# Patient Record
Sex: Female | Born: 1955 | Race: Black or African American | Hispanic: No | Marital: Single | Smoking: Never smoker
Health system: Southern US, Community
[De-identification: ages and names within clinical notes are randomized; demographics above are authoritative.]

## PROBLEM LIST (undated history)

## (undated) DIAGNOSIS — E119 Type 2 diabetes mellitus without complications: Secondary | ICD-10-CM

## (undated) DIAGNOSIS — B009 Herpesviral infection, unspecified: Secondary | ICD-10-CM

## (undated) DIAGNOSIS — I10 Essential (primary) hypertension: Secondary | ICD-10-CM

## (undated) HISTORY — PX: EYE SURGERY: SHX253

## (undated) HISTORY — PX: TONSILLECTOMY: SUR1361

---

## 1998-04-05 ENCOUNTER — Other Ambulatory Visit: Admission: RE | Admit: 1998-04-05 | Discharge: 1998-04-05 | Payer: Self-pay | Admitting: Obstetrics and Gynecology

## 1999-01-23 ENCOUNTER — Encounter (INDEPENDENT_AMBULATORY_CARE_PROVIDER_SITE_OTHER): Payer: Self-pay | Admitting: Specialist

## 1999-01-23 ENCOUNTER — Other Ambulatory Visit: Admission: RE | Admit: 1999-01-23 | Discharge: 1999-01-23 | Payer: Self-pay | Admitting: Obstetrics and Gynecology

## 1999-08-02 ENCOUNTER — Other Ambulatory Visit: Admission: RE | Admit: 1999-08-02 | Discharge: 1999-08-02 | Payer: Self-pay | Admitting: Obstetrics and Gynecology

## 2000-07-31 ENCOUNTER — Other Ambulatory Visit: Admission: RE | Admit: 2000-07-31 | Discharge: 2000-07-31 | Payer: Self-pay | Admitting: Obstetrics and Gynecology

## 2001-09-15 ENCOUNTER — Other Ambulatory Visit: Admission: RE | Admit: 2001-09-15 | Discharge: 2001-09-15 | Payer: Self-pay | Admitting: Obstetrics and Gynecology

## 2002-10-06 ENCOUNTER — Other Ambulatory Visit: Admission: RE | Admit: 2002-10-06 | Discharge: 2002-10-06 | Payer: Self-pay | Admitting: Obstetrics and Gynecology

## 2005-01-10 ENCOUNTER — Other Ambulatory Visit: Admission: RE | Admit: 2005-01-10 | Discharge: 2005-01-10 | Payer: Self-pay | Admitting: Obstetrics and Gynecology

## 2005-04-24 ENCOUNTER — Ambulatory Visit (HOSPITAL_COMMUNITY): Admission: RE | Admit: 2005-04-24 | Discharge: 2005-04-24 | Payer: Self-pay | Admitting: Gastroenterology

## 2007-08-10 ENCOUNTER — Encounter: Admission: RE | Admit: 2007-08-10 | Discharge: 2007-08-10 | Payer: Self-pay | Admitting: Obstetrics and Gynecology

## 2010-02-16 ENCOUNTER — Encounter: Admission: RE | Admit: 2010-02-16 | Discharge: 2010-02-16 | Payer: Self-pay | Admitting: Family Medicine

## 2010-02-27 ENCOUNTER — Other Ambulatory Visit: Admission: RE | Admit: 2010-02-27 | Discharge: 2010-02-27 | Payer: Self-pay | Admitting: Family Medicine

## 2010-08-26 ENCOUNTER — Encounter: Payer: Self-pay | Admitting: Family Medicine

## 2010-12-21 NOTE — Op Note (Signed)
NAMEDICY, SMIGEL                 ACCOUNT NO.:  [id number]   MEDICAL RECORD NO.:  [id number]          PATIENT TYPE:  AMB   LOCATION:  ENDO                         FACILITY:  [facility]   PHYSICIAN:  Anselmo Rod, M.D.  DATE OF BIRTH:  09-04-1955   DATE OF PROCEDURE:  04/24/2005  DATE OF DISCHARGE:                                 OPERATIVE REPORT   PROCEDURE:  Screening colonoscopy.   ENDOSCOPIST:  Charna Elizabeth, M.D.   INSTRUMENT USED:  Olympus video colonoscope.   INDICATIONS FOR PROCEDURE:  A 55 year old [place] American female with  history of iron-deficiency anemia and family history of colon cancer,  undergoing a baseline colonoscopy to rule out colonic polyps, masses, etc.   PREPROCEDURE PREPARATION:  Informed consent was procured from the patient.  The patient was fasted for eight hours prior to the procedure and prepped  with a bottle of magnesium citrate and a gallon of GoLYTELY the night prior  to the procedure.  Risks and benefits of the procedure including a 10%  misread of cancer and polyps were discussed the patient as well.   PREPROCEDURE PHYSICAL:  VITAL SIGNS:  The patient had stable vital signs.  NECK:  Supple.  CHEST:  Clear to auscultation.  CARDIAC:  S1 and S2 regular.  ABDOMEN:  Soft with normal bowel sounds.   DESCRIPTION OF PROCEDURE:  The patient was placed in the left lateral  decubitus position and sedated with 90 mg of Demerol and 10 mg of Versed in  slow incremental doses.  Once the patient was adequately sedated, maintained  on low-flow oxygen and continuous cardiac monitoring, the Olympus video  colonoscope was advanced from the rectum to the cecum.  The appendiceal  orifice and cecal wall were clearly visualized and photographed.  No masses,  polyps, erosions, ulcerations, or diverticulosis were seen.  Retroflexion  revealed no abnormalities.  The patient tolerated the procedure well without  complications.   IMPRESSION:  Normal colonoscopy up to  the cecum.  No masses, polyps or  diverticula seen.   RECOMMENDATIONS:  1.  Considering her family history of colon cancer, a repeat colonoscopy is      recommended in the next five years unless the patient develops any      abnormal symptoms in the interim.  2.  Continue multivitamin and iron supplements.  3.  Outpatient followup as need arises in the future.  The patient has been      advised to see Dr. Sylvester Harder for further treatment of her fibroids      and heavy menstrual cycles.      Anselmo Rod, M.D.  Electronically Signed     JNM/MEDQ  D:  04/24/2005  T:  04/25/2005  Job:  [id number]   cc:   Fayrene Fearing A. Ashley Royalty, M.D.  [address]  [place], [place] [address]  Fax: [phone]   Emeterio Reeve, P.A.

## 2011-02-15 ENCOUNTER — Other Ambulatory Visit: Payer: Self-pay | Admitting: Family Medicine

## 2011-02-15 DIAGNOSIS — Z1231 Encounter for screening mammogram for malignant neoplasm of breast: Secondary | ICD-10-CM

## 2011-02-20 ENCOUNTER — Ambulatory Visit
Admission: RE | Admit: 2011-02-20 | Discharge: 2011-02-20 | Disposition: A | Discharge status: Home / Self Care | Payer: BC Managed Care – PPO | Source: Ambulatory Visit | Attending: Family Medicine | Admitting: Family Medicine

## 2011-02-20 DIAGNOSIS — Z1231 Encounter for screening mammogram for malignant neoplasm of breast: Secondary | ICD-10-CM

## 2011-03-01 ENCOUNTER — Other Ambulatory Visit (HOSPITAL_COMMUNITY)
Admission: RE | Admit: 2011-03-01 | Discharge: 2011-03-01 | Disposition: A | Discharge status: Home / Self Care | Payer: BC Managed Care – PPO | Source: Ambulatory Visit | Attending: Family Medicine | Admitting: Family Medicine

## 2011-03-01 ENCOUNTER — Other Ambulatory Visit: Payer: Self-pay | Admitting: Family Medicine

## 2011-03-01 DIAGNOSIS — Z01419 Encounter for gynecological examination (general) (routine) without abnormal findings: Secondary | ICD-10-CM | POA: Insufficient documentation

## 2011-04-25 ENCOUNTER — Encounter (INDEPENDENT_AMBULATORY_CARE_PROVIDER_SITE_OTHER): Payer: BC Managed Care – PPO | Admitting: Ophthalmology

## 2011-04-25 DIAGNOSIS — H35039 Hypertensive retinopathy, unspecified eye: Secondary | ICD-10-CM

## 2011-04-25 DIAGNOSIS — H33309 Unspecified retinal break, unspecified eye: Secondary | ICD-10-CM

## 2011-04-25 DIAGNOSIS — E11319 Type 2 diabetes mellitus with unspecified diabetic retinopathy without macular edema: Secondary | ICD-10-CM

## 2011-04-25 DIAGNOSIS — H35419 Lattice degeneration of retina, unspecified eye: Secondary | ICD-10-CM

## 2012-04-24 ENCOUNTER — Encounter (INDEPENDENT_AMBULATORY_CARE_PROVIDER_SITE_OTHER): Payer: BC Managed Care – PPO | Admitting: Ophthalmology

## 2013-10-26 ENCOUNTER — Other Ambulatory Visit: Payer: Self-pay | Admitting: Family Medicine

## 2013-10-26 DIAGNOSIS — R599 Enlarged lymph nodes, unspecified: Secondary | ICD-10-CM

## 2013-10-28 ENCOUNTER — Emergency Department (HOSPITAL_COMMUNITY): Payer: BC Managed Care – PPO

## 2013-10-28 ENCOUNTER — Encounter (HOSPITAL_COMMUNITY): Payer: Self-pay | Admitting: Emergency Medicine

## 2013-10-28 ENCOUNTER — Inpatient Hospital Stay (HOSPITAL_COMMUNITY)
Admission: EM | Admit: 2013-10-28 | Discharge: 2013-10-31 | DRG: 579 | Disposition: A | Discharge status: Home / Self Care | Payer: BC Managed Care – PPO | Attending: Internal Medicine | Admitting: Internal Medicine

## 2013-10-28 ENCOUNTER — Inpatient Hospital Stay (HOSPITAL_COMMUNITY): Payer: BC Managed Care – PPO

## 2013-10-28 DIAGNOSIS — R269 Unspecified abnormalities of gait and mobility: Secondary | ICD-10-CM | POA: Diagnosis present

## 2013-10-28 DIAGNOSIS — Z803 Family history of malignant neoplasm of breast: Secondary | ICD-10-CM

## 2013-10-28 DIAGNOSIS — Z880 Allergy status to penicillin: Secondary | ICD-10-CM

## 2013-10-28 DIAGNOSIS — E43 Unspecified severe protein-calorie malnutrition: Secondary | ICD-10-CM | POA: Diagnosis present

## 2013-10-28 DIAGNOSIS — R220 Localized swelling, mass and lump, head: Principal | ICD-10-CM | POA: Diagnosis present

## 2013-10-28 DIAGNOSIS — Z881 Allergy status to other antibiotic agents status: Secondary | ICD-10-CM

## 2013-10-28 DIAGNOSIS — R1310 Dysphagia, unspecified: Secondary | ICD-10-CM | POA: Diagnosis present

## 2013-10-28 DIAGNOSIS — Z6839 Body mass index (BMI) 39.0-39.9, adult: Secondary | ICD-10-CM

## 2013-10-28 DIAGNOSIS — R279 Unspecified lack of coordination: Secondary | ICD-10-CM | POA: Diagnosis present

## 2013-10-28 DIAGNOSIS — Z885 Allergy status to narcotic agent status: Secondary | ICD-10-CM

## 2013-10-28 DIAGNOSIS — D649 Anemia, unspecified: Secondary | ICD-10-CM | POA: Diagnosis present

## 2013-10-28 DIAGNOSIS — Z807 Family history of other malignant neoplasms of lymphoid, hematopoietic and related tissues: Secondary | ICD-10-CM

## 2013-10-28 DIAGNOSIS — D509 Iron deficiency anemia, unspecified: Secondary | ICD-10-CM | POA: Diagnosis present

## 2013-10-28 DIAGNOSIS — R4789 Other speech disturbances: Secondary | ICD-10-CM | POA: Diagnosis present

## 2013-10-28 DIAGNOSIS — R221 Localized swelling, mass and lump, neck: Secondary | ICD-10-CM

## 2013-10-28 DIAGNOSIS — Z91040 Latex allergy status: Secondary | ICD-10-CM

## 2013-10-28 DIAGNOSIS — Z79899 Other long term (current) drug therapy: Secondary | ICD-10-CM

## 2013-10-28 DIAGNOSIS — E669 Obesity, unspecified: Secondary | ICD-10-CM | POA: Diagnosis present

## 2013-10-28 DIAGNOSIS — R609 Edema, unspecified: Secondary | ICD-10-CM | POA: Diagnosis present

## 2013-10-28 DIAGNOSIS — E119 Type 2 diabetes mellitus without complications: Secondary | ICD-10-CM | POA: Diagnosis present

## 2013-10-28 DIAGNOSIS — I10 Essential (primary) hypertension: Secondary | ICD-10-CM | POA: Diagnosis present

## 2013-10-28 DIAGNOSIS — R222 Localized swelling, mass and lump, trunk: Secondary | ICD-10-CM | POA: Diagnosis present

## 2013-10-28 DIAGNOSIS — R479 Unspecified speech disturbances: Secondary | ICD-10-CM | POA: Diagnosis present

## 2013-10-28 HISTORY — DX: Herpesviral infection, unspecified: B00.9

## 2013-10-28 HISTORY — DX: Type 2 diabetes mellitus without complications: E11.9

## 2013-10-28 HISTORY — DX: Essential (primary) hypertension: I10

## 2013-10-28 LAB — CBC WITH DIFFERENTIAL/PLATELET
BASOS ABS: 0.1 10*3/uL (ref 0.0–0.1)
Basophils Relative: 1 % (ref 0–1)
EOS PCT: 2 % (ref 0–5)
Eosinophils Absolute: 0.2 10*3/uL (ref 0.0–0.7)
HCT: 34 % — ABNORMAL LOW (ref 36.0–46.0)
HEMOGLOBIN: 11.5 g/dL — AB (ref 12.0–15.0)
LYMPHS ABS: 2 10*3/uL (ref 0.7–4.0)
Lymphocytes Relative: 23 % (ref 12–46)
MCH: 27 pg (ref 26.0–34.0)
MCHC: 33.8 g/dL (ref 30.0–36.0)
MCV: 79.8 fL (ref 78.0–100.0)
MONO ABS: 0.8 10*3/uL (ref 0.1–1.0)
MONOS PCT: 9 % (ref 3–12)
NEUTROS PCT: 65 % (ref 43–77)
Neutro Abs: 5.8 10*3/uL (ref 1.7–7.7)
PLATELETS: 269 10*3/uL (ref 150–400)
RBC: 4.26 MIL/uL (ref 3.87–5.11)
RDW: 14.1 % (ref 11.5–15.5)
WBC: 8.9 10*3/uL (ref 4.0–10.5)

## 2013-10-28 LAB — BASIC METABOLIC PANEL
BUN: 7 mg/dL (ref 6–23)
CALCIUM: 9.1 mg/dL (ref 8.4–10.5)
CHLORIDE: 101 meq/L (ref 96–112)
CO2: 25 meq/L (ref 19–32)
CREATININE: 0.82 mg/dL (ref 0.50–1.10)
GFR calc non Af Amer: 79 mL/min — ABNORMAL LOW (ref >90)
GLUCOSE: 118 mg/dL — AB (ref 70–99)
Potassium: 4 mEq/L (ref 3.7–5.3)
SODIUM: 135 meq/L — AB (ref 137–147)

## 2013-10-28 MED ORDER — ONDANSETRON HCL 4 MG/2ML IJ SOLN
4.0000 mg | Freq: Once | INTRAMUSCULAR | Status: AC
  Administered 2013-10-28: 4 mg via INTRAVENOUS
  Filled 2013-10-28: qty 2

## 2013-10-28 MED ORDER — IOHEXOL 300 MG/ML  SOLN
100.0000 mL | Freq: Once | INTRAMUSCULAR | Status: AC | PRN
  Administered 2013-10-28: 100 mL via INTRAVENOUS

## 2013-10-28 MED ORDER — SODIUM CHLORIDE 0.9 % IV SOLN: INTRAVENOUS | Status: AC

## 2013-10-28 MED ORDER — DEXAMETHASONE SODIUM PHOSPHATE 4 MG/ML IJ SOLN
4.0000 mg | Freq: Four times a day (QID) | INTRAMUSCULAR | Status: DC
  Administered 2013-10-28 – 2013-10-31 (×12): 4 mg via INTRAVENOUS
  Filled 2013-10-28 (×14): qty 1

## 2013-10-28 MED ORDER — INSULIN ASPART 100 UNIT/ML ~~LOC~~ SOLN
0.0000 [IU] | Freq: Three times a day (TID) | SUBCUTANEOUS | Status: DC
  Administered 2013-10-29: 3 [IU] via SUBCUTANEOUS
  Administered 2013-10-29 (×2): 4 [IU] via SUBCUTANEOUS
  Administered 2013-10-30: 3 [IU] via SUBCUTANEOUS
  Administered 2013-10-31 (×2): 4 [IU] via SUBCUTANEOUS

## 2013-10-28 MED ORDER — MORPHINE SULFATE 4 MG/ML IJ SOLN
4.0000 mg | Freq: Once | INTRAMUSCULAR | Status: AC
  Administered 2013-10-28: 4 mg via INTRAVENOUS
  Filled 2013-10-28: qty 1

## 2013-10-28 NOTE — ED Notes (Signed)
Attempted to call report for second time and RN was unable to take report. Charge Nurse aware.

## 2013-10-28 NOTE — H&P (Signed)
[facility] History and Physical  Amber Staiamela R Lemire ZOX:[id number] DOB: 08/14/1955 DOA: 10/28/2013  Referring physician: Dr. Wilkie AyeHorton PCP: No primary provider on file.   Chief Complaint: left sided neck pain  HPI: Amber Newman is a 58 y.o. female  With history of HTN, diet-controlled diabetes, family history of Hodgkin's lymphoma who presents to the ED with 7-8 day history of left neck swelling and pain as well as left shoulder pain. Patient stated that she got back from work laid down her back and subsequently started to have pain in the left shoulder. Patient placed some ice he had with no help. Patient went to work the next day however wasn't feeling too well had a temperature of [phone] the parents took her to urgent care where she was subsequently started on Levaquin for sinusitis. Patient also states that the pain has not improved his prothrombin and this extended to the back of the neck. Patient stated that she went to see a PCP office 3 days prior to admission and was felt patient may have strep throat and she was subsequently started on clindamycin. Patient said that she did not have any significant improvement subsequently presented to the emergency room. Patient does endorse fevers, chills, cough and congestion with a dry mouth some dysphagia to both solids and liquids and some shortness of breath when she lays down and has a feeling of a gagging sensation. Patient denies any chest pain no abdominal pain no nausea no vomiting no diarrhea no constipation. Patient does endorse some dysuria. Patient also endorses some speech abnormalities and states that his speech has been stagnant as well as some gait imbalance. Patient denies any facial droop. Patient denies any visual difficulties. Patient does endorse some tingling in the right hand fingers. Patient was seen in the emergency room CT of the head which was done was negative. CT of the neck showed a large ill-defined left supraclavicular and  left lower neck mass with associated extensive cervical lymph nodes and edema. Basic metabolic profile obtained at a sodium of 135 otherwise was unremarkable. CBC obtained and a hemoglobin of 11.5 otherwise was unremarkable. We will called to admit the patient for further evaluation and management. Per ED PA ENT has been consulted and Dr. Pollyann Kennedyosen will assess the patient in the morning. Patient is very tearful.    Review of Systems: As per history of present illness otherwise negative. Constitutional:  HEENT:  No headaches, Difficulty swallowing,Tooth/dental problems,Sore throat,  No sneezing, itching, ear ache, nasal congestion, post nasal drip,  Cardio-vascular:  No chest pain, Orthopnea, PND, swelling in lower extremities, anasarca, dizziness, palpitations  GI:  No heartburn, indigestion, abdominal pain, nausea, vomiting, diarrhea, change in bowel habits, loss of appetite  Resp:  No shortness of breath with exertion or at rest. No excess mucus, no productive cough, No non-productive cough, No coughing up of blood.No change in color of mucus.No wheezing.No chest wall deformity  Skin:  no rash or lesions.  GU:  no dysuria, change in color of urine, no urgency or frequency. No flank pain.  Musculoskeletal:  No joint pain or swelling. No decreased range of motion. No back pain.  Psych:  No change in mood or affect. No depression or anxiety. No memory loss.   Past Medical History  Diagnosis Date  . Hypertension   . Diabetes mellitus without complication   . HSV infection   . HTN (hypertension) 10/28/2013  . Diabetes mellitus type 2, diet-controlled 10/28/2013   Past Surgical History  Procedure Laterality Date  . Tonsillectomy    . Eye surgery     Social History:  reports that she has never smoked. She does not have any smokeless tobacco history on file. She reports that she does not drink alcohol. Her drug history is not on file.  Allergies  Allergen Reactions  . Codeine Rash  .  Latex Rash  . Penicillins Rash  . Sulfa Antibiotics Rash    History reviewed. No pertinent family history.   Prior to Admission medications   Medication Sig Start Date End Date Taking? Authorizing Provider  albuterol (PROVENTIL HFA;VENTOLIN HFA) 108 (90 BASE) MCG/ACT inhaler Inhale 2 puffs into the lungs every 4 (four) hours as needed for shortness of breath (cough).   Yes Historical Provider, MD  clindamycin (CLEOCIN) 300 MG capsule Take 300 mg by mouth 3 (three) times daily. 10/26/13  Yes Historical Provider, MD  cyclobenzaprine (FLEXERIL) 5 MG tablet Take 5-10 mg by mouth 3 (three) times daily as needed for muscle spasms.   Yes Historical Provider, MD  pseudoephedrine (SUDAFED) 30 MG tablet Take 30 mg by mouth every 4 (four) hours as needed for congestion.   Yes Historical Provider, MD  amLODipine (NORVASC) 10 MG tablet Take 10 mg by mouth daily.    Historical Provider, MD   Physical Exam: Filed Vitals:   10/28/13 2020  BP: 134/70  Pulse: 108  Temp: 100.1 F (37.8 C)  Resp: 16    BP 134/70  Pulse 108  Temp(Src) 100.1 F (37.8 C) (Oral)  Resp 16  SpO2 98%  General:  Appears calm and comfortable. Occasionally tearful speaking in full sentences however stuttering in no acute cardiopulmonary distress. Eyes: PERRLA, EOMI, normal lids, irises & conjunctiva ENT: grossly normal hearing, lips & tongue. No exudates no tonsillar edema. Neck: Left supraclavicular fullness enlarged and tender with some left neck cervical adenopathy which is also tender to palpation. No cervical adenopathy of the right neck. Lateral neck movement is limited secondary to pain. Cardiovascular: RRR, no m/r/g. No LE edema. Respiratory: CTA bilaterally, no w/r/r. Normal respiratory effort. Abdomen: soft, ntnd, positive bowel sounds, no rebound, no guarding. Skin: no rash or induration seen on limited exam Musculoskeletal: grossly normal tone BUE/BLE Psychiatric: Tearful. grossly normal mood and affect, speech  fluent and appropriate Neurologic: Alert and oriented x3. Cranial nerves II through XII are grossly intact. Sensation is intact. Visual fields are intact. Gait not tested secondary to safety.           Labs on Admission:  Basic Metabolic Panel:  Recent Labs Lab 10/28/13 1716  NA 135*  K 4.0  CL 101  CO2 25  GLUCOSE 118*  BUN 7  CREATININE 0.82  CALCIUM 9.1   Liver Function Tests: No results found for this basename: AST, ALT, ALKPHOS, BILITOT, PROT, ALBUMIN,  in the last 168 hours No results found for this basename: LIPASE, AMYLASE,  in the last 168 hours No results found for this basename: AMMONIA,  in the last 168 hours CBC:  Recent Labs Lab 10/28/13 1716  WBC 8.9  NEUTROABS 5.8  HGB 11.5*  HCT 34.0*  MCV 79.8  PLT 269   Cardiac Enzymes: No results found for this basename: CKTOTAL, CKMB, CKMBINDEX, TROPONINI,  in the last 168 hours  BNP (last 3 results) No results found for this basename: PROBNP,  in the last 8760 hours CBG: No results found for this basename: GLUCAP,  in the last 168 hours  Radiological Exams on Admission: Dg Cervical  Spine Complete  10/28/2013   CLINICAL DATA:  Neck pain and stiffness  EXAM: CERVICAL SPINE  4+ VIEWS  COMPARISON:  None.  FINDINGS: There is no evidence of cervical spine fracture. The prevertebral soft tissues are thickened. There is loss of normal lordosis of cervical spine spine with straightening of cervical spine. No other significant bone abnormalities are identified.  IMPRESSION: Thickening of prevertebral soft tissues. Further evaluation with a cervical spine CT is recommended. Loss of normal lordosis of cervical spine with straightening of cervical spine which could be positional or due to muscle spasm.   Electronically Signed   By: Sherian Rein M.D.   On: 10/28/2013 16:03   Ct Head Wo Contrast  10/28/2013   CLINICAL DATA:  Expressive aphasia  EXAM: CT HEAD WITHOUT CONTRAST  TECHNIQUE: Contiguous axial images were obtained  from the base of the skull through the vertex without intravenous contrast.  COMPARISON:  None.  FINDINGS: The ventricles are normal in size and configuration. No extra-axial fluid collections are identified. The gray-white differentiation is normal. No CT findings for acute intracranial process such as hemorrhage or infarction. No mass lesions. The brainstem and cerebellum are grossly normal.  The bony structures are intact. The paranasal sinuses and mastoid air cells are clear. The globes are intact.  IMPRESSION: No acute intracranial findings or mass lesion.   Electronically Signed   By: Loralie Champagne M.D.   On: 10/28/2013 17:13   Ct Soft Tissue Neck W Contrast  10/28/2013   CLINICAL DATA:  Severe pain.  Neck mass.  EXAM: CT NECK WITH CONTRAST  TECHNIQUE: Multidetector CT imaging of the neck was performed using the standard protocol following the bolus administration of intravenous contrast.  CONTRAST:  OMNIPAQUE IOHEXOL 300 MG/ML  SOLN  COMPARISON:  CT HEAD W/O CM dated 10/28/2013; DG CERVICAL SPINE COMPLETE dated 10/28/2013  FINDINGS: Mucous retention cyst left maxillary sinus. Nasopharynx is clear. Visualized portion of the tongue are unremarkable. Oropharynx and larynx are unremarkable. Trachea is widely patent. Pulmonary apices are clear. Salivary glands are unremarkable. Diffuse submandibular and diffuse cervical lymph nodes are present. These are innumerable. Lymph node measuring up to 11 mm in transverse diameter noted. A large ill-defined left lower neck/ left supraclavicular mass with associated severe edema noted. This occupies the almost the entire left lower neck and supraclavicular space and measures in AP diameter approximately 8 cm. This mass is associated with prominent subcutaneous edema. There is associated prevertebral soft tissue thickening and possible involvement. The adjacent subclavian artery and vein appear to be patent. Left internal jugular vein and left common carotid artery  are patent. Given the absence of a history of direct trauma or infectious symptoms, this ill-defined mass is concerning for a malignancy. No acute cervical spine bony abnormalities identified. Straightening of cervical spine is most likely related to adjacent soft tissue process and/or positioning. Evaluation by Oncology suggested. PET-CT can be obtained for further evaluation. Mammography should also be considered. These results were called by telephone at the time of interpretation on 10/28/2013 at 6:54 PM to Dr. Emilia Beck , who verbally acknowledged these results.  IMPRESSION: Large ill-defined left supraclavicular and left lower neck mass with associated extensive cervical lymph nodes and edema. Given the patient's history this is concerning for a malignancy. Evaluation by Oncology suggested. PET-CT can be obtained for further evaluation. Mammography should also be considered.   Electronically Signed   By: Maisie Fus  Register   On: 10/28/2013 18:56    EKG: Not  done  Assessment/Plan Principal Problem:   Supraclavicular mass Active Problems:   HTN (hypertension)   Diabetes mellitus type 2, diet-controlled   Dysphagia   Speech abnormality   Anemia  #1 left supraclavicular mass Differential includes Hodgkin's versus non-Hodgkin's lymphoma. Patient noted to have fever with some chills with associated left supraclavicular mass with pain as well as some dysphagia, speech abnormalities, gait abnormalities and a sensation of gagging when she is laying down. Patient currently speaking in full sentences however due to airway will admit to the step down unit for close monitoring. Will check MRI of the head secondary to patient's speech abnormalities and gait abnormalities. Will check a hepatic panel, acute hepatitis panel, HIV, EBV panel, sedimentation rate, LDH, CT of the chest, abdomen and pelvis. Patient will likely need a full lymph node excisional biopsy hopefully per ENT. Will place on IV Decadron  for edema. IV fluids. Pain management. Supportive care. ENT has been consulted, Dr. Pollyann Kennedy who will assess the patient in the morning. Spoke with oncology, Dr Clelia Croft who will assess the patient in the morning. Have also spoken to critical care medicine to monitor patient overnight for airway. Follow.  #2 dysphagia Likely secondary to problem #1 with edema noted. Will place patient on clear liquids. Speech therapy evaluation. Place on IV Decadron. Follow.  #3 speech abnormalities/gait ataxia Will check MRI of the head. PT/OT/ST. Follow.  #4 hypertension Stable. Continue home regimen Norvasc.  #5 diet-controlled type 2 diabetes Check a hemoglobin A1c. Place on sliding scale insulin.  #6 anemia Patient with no overt GI bleed. Check an anemia panel. Follow H&H.  #7 prophylaxis PPI for GI prophylaxis. Lovenox for DVT prophylaxis.  Code Status: Full Family Communication: Updated patient and her mother at bedside. Disposition Plan: Admit to step down unit.  Time spent: 26 MINS  [facility] MD [facility] Pager [phone]

## 2013-10-28 NOTE — ED Notes (Signed)
MD at bedside. Hospitalist at bedside. 

## 2013-10-28 NOTE — ED Notes (Signed)
Attempted to call report for third time. RN unable to take report. Press photographerCharge Nurse, Terri notified.

## 2013-10-28 NOTE — ED Notes (Signed)
Attempted to call report. RN unable to take report at this time. Will call back.  

## 2013-10-28 NOTE — ED Notes (Signed)
Pt alert, arrives from home, c/o neck pain, onset last Thursday, denies trauma or injury, PMS intact, resp even unlabored, skin pwd

## 2013-10-28 NOTE — ED Notes (Signed)
PA advised that I was unable to obtain blood for labs after inserting IV. Will collect after CT

## 2013-10-28 NOTE — ED Provider Notes (Signed)
CSN: [id number]     Arrival date & time 10/28/13  1444 History  This chart was scribed for non-physician practitioner working with Shon Batonourtney F Horton, MD by Ashley JacobsBrittany Andrews, ED scribe. This patient was seen in room WTR7/WTR7 and the patient's care was started at 4:10 PM.   First MD Initiated Contact with Patient 10/28/13 1521     Chief Complaint  Patient presents with  . Neck Pain     (Consider location/radiation/quality/duration/timing/severity/associated sxs/prior Treatment) Patient is a 58 y.o. female presenting with neck pain. The history is provided by the patient and medical records. No language interpreter was used.  Neck Pain Pain location:  L side Quality:  Stabbing Pain radiates to:  L scapula and L shoulder Pain severity:  Moderate Pain is:  Same all the time Onset quality:  Sudden Duration:  7 days Timing:  Constant Progression:  Worsening Chronicity:  New Relieved by:  Nothing Worsened by:  Nothing tried Associated symptoms: fever, numbness and tingling   Risk factors: no hx of spinal trauma    HPI Comments: Amber Newman is a 58 y.o. female who presents to the Emergency Department complaining of neck pain, onset of one week ago. Pt mentions the pain onset after waking up and the pain has extended to the left side of her back and L shoulder. The pain is worse with certain movements and angles. She denies prior neck and back swelling. Pt denies prior surgery. Pt had a fever of 102 last week when the symptoms presented. Her average temperature is 97-99.1 throughout this week.  Pt denies trauma and injury. Pt mentions when she swallows she has right sided neck pain, that feels like "reflux". The pain feels like a "dagger" stabbing into her neck.She has numbness and tingling to her right hand. Pt states Flexeril makes her stammer in her speech.  Pt was treated for strep throat at her appointment last week.  Denies numbness and tingling to her face.   Pt's [facility]. Pt denies hx of cancers. Pt states her mother has a hx of non-Hodgkin's Lymphoma.   Past Medical History  Diagnosis Date  . Hypertension   . Diabetes mellitus without complication   . HSV infection    Past Surgical History  Procedure Laterality Date  . Tonsillectomy    . Eye surgery     No family history on file. History  Substance Use Topics  . Smoking status: Never Smoker   . Smokeless tobacco: Not on file  . Alcohol Use: No   OB History   Grav Para Term Preterm Abortions TAB SAB Ect Mult Living                 Review of Systems  Constitutional: Positive for fever.  Musculoskeletal: Positive for arthralgias, myalgias and neck pain.  Neurological: Positive for tingling and numbness. Negative for facial asymmetry.  All other systems reviewed and are negative.   Allergies  Codeine; Latex; Penicillins; and Sulfa antibiotics  Home Medications  No current outpatient prescriptions on file.  BP 138/77  Pulse 107  Temp(Src) 98.5 F (36.9 C) (Oral)  Resp 18  SpO2 96%  Physical Exam  Nursing note and vitals reviewed. Constitutional: She is oriented to person, place, and time. She appears well-developed and well-nourished. No distress.  HENT:  Head: Normocephalic and atraumatic.  Enlarged tender supraclavicular lymph node on the left L neck cervical adenopathy and tender to palpation No cervical adenopathy of right  neck Lateral neck movement is limited due to pain Oropharynx no tonsillar edema or exudate     Eyes: EOM are normal. Pupils are equal, round, and reactive to light.  Neck: Normal range of motion. Neck supple. No tracheal deviation present.  Cardiovascular: Normal rate.   Pulmonary/Chest: Effort normal. No respiratory distress.  Abdominal: Soft. She exhibits no distension.  Musculoskeletal: Normal range of motion. She exhibits tenderness.  No midline spine tenderness to palpation   Neurological: She is alert and oriented to  person, place, and time.  paraesthesia of right fingers Upper extremities sensation equal and intact bilaterally Mild occasional expressive aphasia noted    Skin: Skin is warm and dry.  Psychiatric: She has a normal mood and affect. Her behavior is normal.    ED Course  Procedures (including critical care time) DIAGNOSTIC STUDIES: Oxygen Saturation is 96% on room air, normal by my interpretation.    COORDINATION OF CARE:  4:14 PM Discussed course of care with pt which includes CT of neck, head and cervical spine and cervical spine x-ray with laboratory tests Pt understands and agrees.  7:12 PM-Informed pt of radiology findings, will begin pt on IV medications, will admit patient and contact oncologist. Dr. Wilkie Aye will evaluated patient.  Labs Review Labs Reviewed  CBC WITH DIFFERENTIAL - Abnormal; Notable for the following:    Hemoglobin 11.5 (*)    HCT 34.0 (*)    All other components within normal limits  BASIC METABOLIC PANEL - Abnormal; Notable for the following:    Sodium 135 (*)    Glucose, Bld 118 (*)    GFR calc non Af Amer 79 (*)    All other components within normal limits  URINE CULTURE  URINALYSIS, ROUTINE W REFLEX MICROSCOPIC  VITAMIN B12  FOLATE  IRON AND TIBC  FERRITIN   Imaging Review No results found.   EKG Interpretation None      MDM   Final diagnoses:  Neck mass    8:12 PM Patient is a 58 year old female with a family history of non-hodgkins lymphoma in her mother who presents with a left neck mass that has been present for 1 week. Patient reports initial fever of 102 last week when the symptoms started and has no fever since then. She has been on levaquin and clindamycin for the past week that was prescribe by Urgent Care. Patient came to the ED today due to excruciating pain. Patient is slightly tachycardic on exam and reports difficulty swallowing as well as right hand paresthesias and some "stammering" of her words. Patient is afebrile.    Patient's CT of head is unremarkable for acute changes. Patient's neck CT shows a large mass that occupies the left supraclavicular space and extends into the left neck. The mass is concerning malignancy. Patient's labs are unremarkable for acute changes. Patient initially declined pain medication and is now tearful due to pain so I ordered IV morphine.   I spoke with Hospitalist Dr. Janee Morn who will admit the patient. Dr. Pollyann Kennedy with ENT will see the patient tomorrow morning.   I personally performed the services described in this documentation, which was scribed in my presence. The recorded information has been reviewed and is accurate.    Emilia Beck, PA-C 11/01/13 [phone]

## 2013-10-29 ENCOUNTER — Inpatient Hospital Stay (HOSPITAL_COMMUNITY): Payer: BC Managed Care – PPO

## 2013-10-29 ENCOUNTER — Other Ambulatory Visit: Payer: BC Managed Care – PPO

## 2013-10-29 ENCOUNTER — Encounter (HOSPITAL_COMMUNITY): Payer: Self-pay | Admitting: Radiology

## 2013-10-29 DIAGNOSIS — E43 Unspecified severe protein-calorie malnutrition: Secondary | ICD-10-CM | POA: Insufficient documentation

## 2013-10-29 DIAGNOSIS — R221 Localized swelling, mass and lump, neck: Principal | ICD-10-CM

## 2013-10-29 DIAGNOSIS — R220 Localized swelling, mass and lump, head: Principal | ICD-10-CM

## 2013-10-29 DIAGNOSIS — D649 Anemia, unspecified: Secondary | ICD-10-CM

## 2013-10-29 LAB — GLUCOSE, CAPILLARY
GLUCOSE-CAPILLARY: 152 mg/dL — AB (ref 70–99)
GLUCOSE-CAPILLARY: 171 mg/dL — AB (ref 70–99)
Glucose-Capillary: 151 mg/dL — ABNORMAL HIGH (ref 70–99)
Glucose-Capillary: 135 mg/dL — ABNORMAL HIGH (ref 70–99)

## 2013-10-29 LAB — URINALYSIS, ROUTINE W REFLEX MICROSCOPIC
BILIRUBIN URINE: NEGATIVE
Glucose, UA: NEGATIVE mg/dL
HGB URINE DIPSTICK: NEGATIVE
Ketones, ur: NEGATIVE mg/dL
NITRITE: NEGATIVE
Protein, ur: NEGATIVE mg/dL
UROBILINOGEN UA: 0.2 mg/dL (ref 0.0–1.0)
pH: 6.5 (ref 5.0–8.0)

## 2013-10-29 LAB — COMPREHENSIVE METABOLIC PANEL
ALK PHOS: 100 U/L (ref 39–117)
ALT: 25 U/L (ref 0–35)
AST: 18 U/L (ref 0–37)
Albumin: 2.5 g/dL — ABNORMAL LOW (ref 3.5–5.2)
BUN: 8 mg/dL (ref 6–23)
CO2: 24 meq/L (ref 19–32)
Calcium: 8.8 mg/dL (ref 8.4–10.5)
Chloride: 101 mEq/L (ref 96–112)
Creatinine, Ser: 0.82 mg/dL (ref 0.50–1.10)
GFR, EST NON AFRICAN AMERICAN: 79 mL/min — AB (ref >90)
Glucose, Bld: 152 mg/dL — ABNORMAL HIGH (ref 70–99)
Potassium: 4 mEq/L (ref 3.7–5.3)
Sodium: 136 mEq/L — ABNORMAL LOW (ref 137–147)
TOTAL PROTEIN: 8.9 g/dL — AB (ref 6.0–8.3)
Total Bilirubin: 0.5 mg/dL (ref 0.3–1.2)

## 2013-10-29 LAB — URINE MICROSCOPIC-ADD ON

## 2013-10-29 LAB — VITAMIN B12: Vitamin B-12: >2000 pg/mL — ABNORMAL HIGH (ref 211–911)

## 2013-10-29 LAB — APTT
APTT: 64 s — AB (ref 24–37)
aPTT: 30 seconds (ref 24–37)

## 2013-10-29 LAB — HIV ANTIBODY (ROUTINE TESTING W REFLEX): HIV: NONREACTIVE

## 2013-10-29 LAB — HEPATIC FUNCTION PANEL
ALBUMIN: 2.5 g/dL — AB (ref 3.5–5.2)
ALK PHOS: 99 U/L (ref 39–117)
ALT: 24 U/L (ref 0–35)
AST: 19 U/L (ref 0–37)
Bilirubin, Direct: <0.2 mg/dL (ref 0.0–0.3)
Total Bilirubin: 0.5 mg/dL (ref 0.3–1.2)
Total Protein: 8.6 g/dL — ABNORMAL HIGH (ref 6.0–8.3)

## 2013-10-29 LAB — HEPATITIS PANEL, ACUTE
HCV Ab: NEGATIVE
Hep A IgM: NONREACTIVE
Hep B C IgM: NONREACTIVE
Hepatitis B Surface Ag: NEGATIVE

## 2013-10-29 LAB — MAGNESIUM: Magnesium: 1.9 mg/dL (ref 1.5–2.5)

## 2013-10-29 LAB — HEMOGLOBIN A1C
Hgb A1c MFr Bld: 7.6 % — ABNORMAL HIGH (ref <5.7)
Mean Plasma Glucose: 171 mg/dL — ABNORMAL HIGH (ref <117)

## 2013-10-29 LAB — CBC
HEMATOCRIT: 31.3 % — AB (ref 36.0–46.0)
Hemoglobin: 10.5 g/dL — ABNORMAL LOW (ref 12.0–15.0)
MCH: 26.9 pg (ref 26.0–34.0)
MCHC: 33.5 g/dL (ref 30.0–36.0)
MCV: 80.1 fL (ref 78.0–100.0)
Platelets: 270 10*3/uL (ref 150–400)
RBC: 3.91 MIL/uL (ref 3.87–5.11)
RDW: 14.3 % (ref 11.5–15.5)
WBC: 7.8 10*3/uL (ref 4.0–10.5)

## 2013-10-29 LAB — FERRITIN: FERRITIN: 326 ng/mL — AB (ref 10–291)

## 2013-10-29 LAB — IRON AND TIBC
Iron: 29 ug/dL — ABNORMAL LOW (ref 42–135)
SATURATION RATIOS: 11 % — AB (ref 20–55)
TIBC: 267 ug/dL (ref 250–470)
UIBC: 238 ug/dL (ref 125–400)

## 2013-10-29 LAB — LACTATE DEHYDROGENASE: LDH: 202 U/L (ref 94–250)

## 2013-10-29 LAB — FOLATE

## 2013-10-29 LAB — SEDIMENTATION RATE: Sed Rate: 96 mm/hr — ABNORMAL HIGH (ref 0–22)

## 2013-10-29 LAB — MRSA PCR SCREENING: MRSA by PCR: NEGATIVE

## 2013-10-29 LAB — PROTIME-INR
INR: 1.14 (ref 0.00–1.49)
Prothrombin Time: 14.4 seconds (ref 11.6–15.2)

## 2013-10-29 MED ORDER — ONDANSETRON HCL 4 MG PO TABS: 4.0000 mg | ORAL_TABLET | Freq: Four times a day (QID) | ORAL | Status: DC | PRN

## 2013-10-29 MED ORDER — IOHEXOL 300 MG/ML  SOLN
100.0000 mL | Freq: Once | INTRAMUSCULAR | Status: AC | PRN
  Administered 2013-10-29: 100 mL via INTRAVENOUS

## 2013-10-29 MED ORDER — ONDANSETRON HCL 4 MG/2ML IJ SOLN: 4.0000 mg | Freq: Four times a day (QID) | INTRAMUSCULAR | Status: DC | PRN

## 2013-10-29 MED ORDER — IPRATROPIUM BROMIDE 0.02 % IN SOLN: 0.5000 mg | RESPIRATORY_TRACT | Status: DC | PRN

## 2013-10-29 MED ORDER — PANTOPRAZOLE SODIUM 40 MG PO TBEC
40.0000 mg | DELAYED_RELEASE_TABLET | Freq: Every day | ORAL | Status: DC
  Administered 2013-10-29 – 2013-10-31 (×3): 40 mg via ORAL
  Filled 2013-10-29 (×5): qty 1

## 2013-10-29 MED ORDER — MAGNESIUM CITRATE PO SOLN: 1.0000 | Freq: Once | ORAL | Status: AC | PRN

## 2013-10-29 MED ORDER — MORPHINE SULFATE 2 MG/ML IJ SOLN: 2.0000 mg | INTRAMUSCULAR | Status: DC | PRN

## 2013-10-29 MED ORDER — OXYCODONE HCL 5 MG PO TABS: 5.0000 mg | ORAL_TABLET | ORAL | Status: DC | PRN

## 2013-10-29 MED ORDER — SODIUM CHLORIDE 0.9 % IV SOLN: 250.0000 mL | INTRAVENOUS | Status: DC | PRN

## 2013-10-29 MED ORDER — POLYETHYLENE GLYCOL 3350 17 G PO PACK
17.0000 g | PACK | Freq: Every day | ORAL | Status: DC | PRN
  Filled 2013-10-29: qty 1

## 2013-10-29 MED ORDER — ALBUTEROL SULFATE (2.5 MG/3ML) 0.083% IN NEBU: 2.5000 mg | INHALATION_SOLUTION | RESPIRATORY_TRACT | Status: DC | PRN

## 2013-10-29 MED ORDER — AMLODIPINE BESYLATE 10 MG PO TABS
10.0000 mg | ORAL_TABLET | Freq: Every day | ORAL | Status: DC
  Administered 2013-10-29 – 2013-10-31 (×3): 10 mg via ORAL
  Filled 2013-10-29 (×3): qty 1

## 2013-10-29 MED ORDER — SODIUM CHLORIDE 0.9 % IJ SOLN: 3.0000 mL | Freq: Two times a day (BID) | INTRAMUSCULAR | Status: DC

## 2013-10-29 MED ORDER — SODIUM CHLORIDE 0.9 % IJ SOLN: 3.0000 mL | INTRAMUSCULAR | Status: DC | PRN

## 2013-10-29 MED ORDER — SORBITOL 70 % SOLN
30.0000 mL | Freq: Every day | Status: DC | PRN
  Filled 2013-10-29: qty 30

## 2013-10-29 MED ORDER — IOHEXOL 300 MG/ML  SOLN: 25.0000 mL | INTRAMUSCULAR | Status: AC

## 2013-10-29 MED ORDER — ALUM & MAG HYDROXIDE-SIMETH 200-200-20 MG/5ML PO SUSP: 30.0000 mL | Freq: Four times a day (QID) | ORAL | Status: DC | PRN

## 2013-10-29 MED ORDER — ACETAMINOPHEN 325 MG PO TABS: 650.0000 mg | ORAL_TABLET | Freq: Four times a day (QID) | ORAL | Status: DC | PRN

## 2013-10-29 MED ORDER — ACETAMINOPHEN 650 MG RE SUPP: 650.0000 mg | Freq: Four times a day (QID) | RECTAL | Status: DC | PRN

## 2013-10-29 MED ORDER — ENOXAPARIN SODIUM 40 MG/0.4ML ~~LOC~~ SOLN: 40.0000 mg | SUBCUTANEOUS | Status: DC

## 2013-10-29 MED ORDER — GADOBENATE DIMEGLUMINE 529 MG/ML IV SOLN
20.0000 mL | Freq: Once | INTRAVENOUS | Status: AC | PRN
  Administered 2013-10-29: 20 mL via INTRAVENOUS

## 2013-10-29 NOTE — Progress Notes (Signed)
INITIAL NUTRITION ASSESSMENT  Pt meets criteria for severe MALNUTRITION in the context of acute illness as evidenced by <50% estimated energy intake with 2.4% weight loss in the past week per pt report.  DOCUMENTATION CODES Per approved criteria  -Severe malnutrition in the context of acute illness or injury -Obesity Unspecified   INTERVENTION: - Diet advancement per SLP/MD - Resource Breeze BID - Briefly discussed diabetic diet per pt request, handouts provided with RD contact information - RD to continue to monitor   NUTRITION DIAGNOSIS: Inadequate oral intake related to clear liquid diet as evidenced by diet order.   Goal: Advance diet as tolerated to diabetic diet  Monitor:  Weights, labs, diet advancement, dysphagia   Reason for Assessment: Malnutrition screening tool   58 y.o. female  Admitting Dx: Supraclavicular mass  ASSESSMENT: Pt with history of HTN, diet-controlled diabetes, family history of Hodgkin's lymphoma who presents to the ED with 7-8 day history of left neck swelling and pain as well as left shoulder pain. CT of the neck showed a large ill-defined left supraclavicular and left lower neck mass with associated extensive cervical lymph nodes and edema per MD notes.   Met with pt who reports eating 25% of usual intake of food in the past week due to difficulty swallowing. States since Monday she has only consumed yogurt, applesauce, and water. Usual intake is peanut butter and jelly, chicken soup, and some fast food. Reports 6 pound unintended weight loss in the past week. Describes dysphagia as painful and feels like something in stuck in the right back side of her neck. C/o it's harder to swallow water than it is yogurt. Had left sided shoulder pain that stretched to her arm but this pain has improved with medication. Denies any significant muscle weakness but was dizzy in the past week.   Nutrition Focused Physical Exam:  Subcutaneous Fat:  Orbital Region:  WNL Upper Arm Region: mild/moderate muscle wasting Thoracic and Lumbar Region: WNL  Muscle:  Temple Region: WNL Clavicle Bone Region: WNL Clavicle and Acromion Bone Region: WNL Scapular Bone Region: NA Dorsal Hand: WNL Patellar Region: WNL Anterior Thigh Region: WNL Posterior Calf Region: WNL  Edema: None noted    Height: Ht Readings from Last 1 Encounters:  10/29/13 5' 6.5" (1.689 m)    Weight: Wt Readings from Last 1 Encounters:  10/29/13 246 lb 4.1 oz (111.7 kg)    Ideal Body Weight: 130 lbs  % Ideal Body Weight: 189%  Wt Readings from Last 10 Encounters:  10/29/13 246 lb 4.1 oz (111.7 kg)    Usual Body Weight: 252 lbs per pt  % Usual Body Weight: 98%  BMI:  Body mass index is 39.16 kg/(m^2). Class II obesity  Estimated Nutritional Needs: Kcal: 1500-1700 Protein: 70-80g Fluid: 1.5-1.7L/day  Skin: Intact  Diet Order: Clear Liquid  EDUCATION NEEDS: -Education needs addressed - educated pt on diabetic diet per her request, handouts provided with RD contact information    Intake/Output Summary (Last 24 hours) at 10/29/13 1454 Last data filed at 10/29/13 1100  Gross per 24 hour  Intake 583.75 ml  Output   3250 ml  Net -2666.25 ml    Last BM: PTA   Labs:   Recent Labs Lab 10/28/13 1716 10/29/13 0125  NA 135* 136*  K 4.0 4.0  CL 101 101  CO2 25 24  BUN 7 8  CREATININE 0.82 0.82  CALCIUM 9.1 8.8  MG  --  1.9  GLUCOSE 118* 152*  CBG (last 3)   Recent Labs  10/29/13 0759 10/29/13 1120  GLUCAP 152* 151*    Scheduled Meds: . amLODipine  10 mg Oral Daily  . dexamethasone  4 mg Intravenous 4 times per day  . insulin aspart  0-20 Units Subcutaneous TID WC  . pantoprazole  40 mg Oral Q0600  . sodium chloride  3 mL Intravenous Q12H  . sodium chloride  3 mL Intravenous Q12H    Continuous Infusions: . sodium chloride 75 mL/hr at 10/28/13 2230    Past Medical History  Diagnosis Date  . Hypertension   . Diabetes mellitus  without complication   . HSV infection   . HTN (hypertension) 10/28/2013  . Diabetes mellitus type 2, diet-controlled 10/28/2013    Past Surgical History  Procedure Laterality Date  . Tonsillectomy    . Eye surgery      Mikey College MS, [place], [place] Pager [phone] After Hours Pager

## 2013-10-29 NOTE — Progress Notes (Signed)
[facility] PROGRESS NOTE  Amber Newman BWL:[id number] DOB: [birth year] DOA: 10/28/2013 PCP: Dr [name]   Brief narrative 59 y.o. female With history of HTN, diet-controlled diabetes, family history of Hodgkin's lymphoma who presents to the ED with 7-8 day history of left neck swelling and pain as well as left shoulder pain. Patient found to have large left supraclavicular mass.   Assessment/Plan:  left supraclavicular mass  concerning for  lymphoma. Patient noted to have fever with some chills with associated left supraclavicular mass with pain as well as some dysphagia, speech abnormalities, gait abnormalities and a sensation of gagging when she is laying down.  MRI brain shows no cerebral lesion. Does comments on prominent retropharyngeal soft tissue swelling ?abscess. Concern for extracapsular spread with enlarged left lacrimal gland.  LFTs normal. LDH normal, hepatitis panel negative. ESR elevated. HIV ab negative. CT of the chest, abdomen and pelvis pending.  Discussed with ENT Dr [name] this am who recommends for FNAC of the mass. Will request IR for procedure.  oncology consult pending.  dysphagia  Likely secondary to above with edema noted on CT .  Continue  clear liquids. Speech therapy evaluation. Continue on IV Decadron.  speech abnormalities/gait ataxia  MRI unremarkable. Pending PT/OT swallow eval  Anemia  iron panel suggests iron deficiency. ? Related to malignancy. Check stool for occult blood. Will monitor for now  hypertension  Stable. Continue home regimen   diet-controlled type 2 diabetes  sliding scale insulin. A1C of 7.6   #7 prophylaxis  PPI for GI prophylaxis. Lovenox for DVT prophylaxis.      Code Status: full code Family Communication: none at bedside Disposition Plan: Home once w/up completed   Consultants:  ENT   IR  oncology  Procedures:  IR guided bx  Antibiotics:  none  HPI/Subjective: Admission H&P  reviewed  Objective: Filed Vitals:   10/29/13 1100  BP:   Pulse:   Temp: 98.8 F (37.1 C)  Resp:     Intake/Output Summary (Last 24 hours) at 10/29/13 1152 Last data filed at 10/29/13 1100  Gross per 24 hour  Intake 583.75 ml  Output   2450 ml  Net -1866.25 ml   Filed Weights   10/29/13 0041 10/29/13 0600  Weight: 111.9 kg (246 lb 11.1 oz) 111.7 kg (246 lb 4.1 oz)    Exam:   General:  Middle aged obese female in NAD. appears anxious  HEENT: no pallor, moist oral mucosa, soft to firmmass over left neck with ill defined margin. Non tender, no cervical , supraclavicular or axillary LN  Cardiovascular: NS1&S2, no murmurs, rubs or gallop  Respiratory: clear b/l, no added sounds  Abdomen: soft, NT ND, BS+  Musculoskeletal: soft, NT, ND, BS+  Ext: warm, no edema   Data Reviewed: Basic Metabolic Panel:  Recent Labs Lab 10/28/13 1716 10/29/13 0125  NA 135* 136*  K 4.0 4.0  CL 101 101  CO2 25 24  GLUCOSE 118* 152*  BUN 7 8  CREATININE 0.82 0.82  CALCIUM 9.1 8.8  MG  --  1.9   Liver Function Tests:  Recent Labs Lab 10/29/13 0125  AST 19  18  ALT 24  25  ALKPHOS 99  100  BILITOT 0.5  0.5  PROT 8.6*  8.9*  ALBUMIN 2.5*  2.5*   No results found for this basename: LIPASE, AMYLASE,  in the last 168 hours No results found for this basename: AMMONIA,  in the last 168 hours CBC:  Recent Labs  Lab 10/28/13 1716 10/29/13 0125  WBC 8.9 7.8  NEUTROABS 5.8  --   HGB 11.5* 10.5*  HCT 34.0* 31.3*  MCV 79.8 80.1  PLT 269 270   Cardiac Enzymes: No results found for this basename: CKTOTAL, CKMB, CKMBINDEX, TROPONINI,  in the last 168 hours BNP (last 3 results) No results found for this basename: PROBNP,  in the last 8760 hours CBG:  Recent Labs Lab 10/29/13 0759 10/29/13 1120  GLUCAP 152* 151*    Recent Results (from the past 240 hour(s))  MRSA PCR SCREENING     Status: None   Collection Time    10/29/13 12:27 AM      Result Value Ref  Range Status   MRSA by PCR NEGATIVE  NEGATIVE Final   Comment:            The GeneXpert MRSA Assay (FDA     approved for NASAL specimens     only), is one component of a     comprehensive MRSA colonization     surveillance program. It is not     intended to diagnose MRSA     infection nor to guide or     monitor treatment for     MRSA infections.     Studies: Dg Chest 2 View  10/29/2013   CLINICAL DATA:  Chest pain.  History of hypertension.  EXAM: CHEST  2 VIEW  COMPARISON:  No priors.  FINDINGS: Lung volumes are normal. No consolidative airspace disease. No pleural effusions. No pneumothorax. No pulmonary nodule or mass noted. Pulmonary vasculature and the cardiomediastinal silhouette are within normal limits.  IMPRESSION: 1.  No radiographic evidence of acute cardiopulmonary disease.   Electronically Signed   By: Vinnie Langton M.D.   On: 10/29/2013 00:00   Dg Cervical Spine Complete  10/28/2013   CLINICAL DATA:  Neck pain and stiffness  EXAM: CERVICAL SPINE  4+ VIEWS  COMPARISON:  None.  FINDINGS: There is no evidence of cervical spine fracture. The prevertebral soft tissues are thickened. There is loss of normal lordosis of cervical spine spine with straightening of cervical spine. No other significant bone abnormalities are identified.  IMPRESSION: Thickening of prevertebral soft tissues. Further evaluation with a cervical spine CT is recommended. Loss of normal lordosis of cervical spine with straightening of cervical spine which could be positional or due to muscle spasm.   Electronically Signed   By: Abelardo Diesel M.D.   On: 10/28/2013 16:03   Ct Head Wo Contrast  10/28/2013   CLINICAL DATA:  Expressive aphasia  EXAM: CT HEAD WITHOUT CONTRAST  TECHNIQUE: Contiguous axial images were obtained from the base of the skull through the vertex without intravenous contrast.  COMPARISON:  None.  FINDINGS: The ventricles are normal in size and configuration. No extra-axial fluid collections  are identified. The gray-white differentiation is normal. No CT findings for acute intracranial process such as hemorrhage or infarction. No mass lesions. The brainstem and cerebellum are grossly normal.  The bony structures are intact. The paranasal sinuses and mastoid air cells are clear. The globes are intact.  IMPRESSION: No acute intracranial findings or mass lesion.   Electronically Signed   By: Kalman Jewels M.D.   On: 10/28/2013 17:13   Ct Soft Tissue Neck W Contrast  10/28/2013   CLINICAL DATA:  Severe pain.  Neck mass.  EXAM: CT NECK WITH CONTRAST  TECHNIQUE: Multidetector CT imaging of the neck was performed using the standard protocol following the bolus administration  of intravenous contrast.  CONTRAST:  123m OMNIPAQUE IOHEXOL 300 MG/ML  SOLN  COMPARISON:  CT HEAD W/O CM dated 10/28/2013; DG CERVICAL SPINE COMPLETE dated 10/28/2013  FINDINGS: Mucous retention cyst left maxillary sinus. Nasopharynx is clear. Visualized portion of the tongue are unremarkable. Oropharynx and larynx are unremarkable. Trachea is widely patent. Pulmonary apices are clear. Salivary glands are unremarkable. Diffuse submandibular and diffuse cervical lymph nodes are present. These are innumerable. Lymph node measuring up to 11 mm in transverse diameter noted. A large ill-defined left lower neck/ left supraclavicular mass with associated severe edema noted. This occupies the almost the entire left lower neck and supraclavicular space and measures in AP diameter approximately 8 cm. This mass is associated with prominent subcutaneous edema. There is associated prevertebral soft tissue thickening and possible involvement. The adjacent subclavian artery and vein appear to be patent. Left internal jugular vein and left common carotid artery are patent. Given the absence of a history of direct trauma or infectious symptoms, this ill-defined mass is concerning for a malignancy. No acute cervical spine bony abnormalities identified.  Straightening of cervical spine is most likely related to adjacent soft tissue process and/or positioning. Evaluation by Oncology suggested. PET-CT can be obtained for further evaluation. Mammography should also be considered. These results were called by telephone at the time of interpretation on 10/28/2013 at 6:54 PM to Dr. KAlvina Chou, who verbally acknowledged these results.  IMPRESSION: Large ill-defined left supraclavicular and left lower neck mass with associated extensive cervical lymph nodes and edema. Given the patient's history this is concerning for a malignancy. Evaluation by Oncology suggested. PET-CT can be obtained for further evaluation. Mammography should also be considered.   Electronically Signed   By: TMarcello Moores Register   On: 10/28/2013 18:56   Mr BJeri CosWo Contrast  10/29/2013   CLINICAL DATA:  Abnormal speech. Supraclavicular mass. Possible CVA. Evaluate for intracranial lesion.  EXAM: MRI HEAD WITHOUT AND WITH CONTRAST  TECHNIQUE: Multiplanar, multiecho pulse sequences of the brain and surrounding structures were obtained without and with intravenous contrast.  CONTRAST:  255mMULTIHANCE GADOBENATE DIMEGLUMINE 529 MG/ML IV SOLN  COMPARISON:  CT NECK W/CM dated 10/28/2013; CT HEAD W/O CM dated 10/28/2013  FINDINGS: No evidence for acute infarction, hemorrhage, mass lesion, hydrocephalus, or extra-axial fluid. Slight premature cerebral and cerebellar atrophy. Mild subcortical and periventricular T2 and FLAIR hyperintensities, likely chronic microvascular ischemic change. Flow voids are maintained throughout the carotid, basilar, and vertebral arteries. There are no areas of chronic hemorrhage.  Sagittal images reveal mild prominence of the retropharyngeal soft tissues from C3 and above. Mild nasopharyngeal adenoidal hypertrophy. No osseous abnormalities.  Post infusion, no abnormal enhancement of the brain or meninges. Slight prominence of the epidural veins upper cervical region.  Widespread cervical adenopathy. No acute sinus or mastoid disease.  There is asymmetric prominence of left lacrimal gland which appears enlarged. No intraconal mass.  IMPRESSION: Mild atrophy and small vessel disease. No acute intracranial abnormalities.  Mild prominence of the retropharyngeal soft tissues. I doubt this is an abscess, but relates to the supraclavicular mass.  Asymmetric prominence of the left lacrimal gland which appears enlarged. In the setting of widespread lymphadenopathy, and a large supraclavicular mass (possible extracapsular spread?), the findings may represent non-Hodgkin's lymphoma. Tissue sampling of an accessible lymph node may be warranted, along with additional CT chest, abdomen, and pelvis imaging.   Electronically Signed   By: JoRolla Flatten.D.   On: 10/29/2013 08:37    Scheduled  Meds: . amLODipine  10 mg Oral Daily  . dexamethasone  4 mg Intravenous 4 times per day  . insulin aspart  0-20 Units Subcutaneous TID WC  . pantoprazole  40 mg Oral Q0600  . sodium chloride  3 mL Intravenous Q12H  . sodium chloride  3 mL Intravenous Q12H   Continuous Infusions: . sodium chloride 75 mL/hr at 10/28/13 2230       Time spent: 25 minutes    Terita Hejl, [place]  [facility] Pager [phone] If 7PM-7AM, please contact night-coverage at [email or web address], password [email or web address] 10/29/2013, 11:52 AM  LOS: 1 day

## 2013-10-29 NOTE — Evaluation (Signed)
Physical Therapy Evaluation Patient Details Name: Amber Newman MRN: [id number] DOB: 1955-10-17 Today's Date: 10/29/2013   History of Present Illness  58 yo female admitted with supraclavicular mass. hx of HTN, DM, Hodgkins lymphoma  Clinical Impression  On eval, pt required Min guard assist for mobility-able to ambulate ~140 feet with walker. Unsteady at times. Discussed DME-pt declines walker-prefers to use "walking stick". Recommend HHPT, depending on progress. Will follow during stay.     Follow Up Recommendations Home health PT;No PT follow up;Supervision - Intermittent (depending on progress)    Equipment Recommendations   (pt declines RW at this time. Prefers use of cane/walking stick)    Recommendations for Other Services OT consult     Precautions / Restrictions Precautions Precautions: Fall Restrictions Weight Bearing Restrictions: No      Mobility  Bed Mobility Overal bed mobility: Modified Independent                Transfers Overall transfer level: Needs assistance   Transfers: Sit to/from Stand Sit to Stand: Min guard         General transfer comment: close guard for safety.   Ambulation/Gait Ambulation/Gait assistance: Min guard Ambulation Distance (Feet): 140 Feet Assistive device: Rolling walker (2 wheeled) Gait Pattern/deviations: Decreased stride length     General Gait Details: slow gait speed. Intermittent LE unsteadiness noted. dyspnea 2/4  Stairs            Wheelchair Mobility    Modified Rankin (Stroke Patients Only)       Balance                                     Pertinent Vitals/Pain Neck pain 3/10    Home Living Family/patient expects to be discharged to:: Private residence Living Arrangements: Parent   Type of Home: House Home Access: Stairs to enter   Secretary/administrator of Steps: 6 Home Layout: One level Home Equipment: None      Prior Function Level of Independence: Independent          Comments: pt states was furniture walking     Hand Dominance        Extremity/Trunk Assessment   Upper Extremity Assessment: Overall WFL for tasks assessed           Lower Extremity Assessment: Generalized weakness      Cervical / Trunk Assessment: Normal  Communication   Communication: No difficulties  Cognition Arousal/Alertness: Awake/Newman Behavior During Therapy: WFL for tasks assessed/performed Overall Cognitive Status: Within Functional Limits for tasks assessed                      General Comments      Exercises        Assessment/Plan    PT Assessment Patient needs continued PT services  PT Diagnosis Difficulty walking;Generalized weakness   PT Problem List Decreased strength;Decreased activity tolerance;Decreased balance;Decreased mobility;Obesity;Decreased knowledge of use of DME  PT Treatment Interventions DME instruction;Gait training;Functional mobility training;Therapeutic activities;Therapeutic exercise;Patient/family education;Balance training   PT Goals (Current goals can be found in the Care Plan section) Acute Rehab PT Goals Patient Stated Goal: home soon PT Goal Formulation: With patient Time For Goal Achievement: 11/12/13 Potential to Achieve Goals: Good    Frequency Min 3X/week   Barriers to discharge        End of Session Equipment Utilized During Treatment: Gait belt Activity Tolerance: Patient  tolerated treatment well Patient left: in bed;with call bell/phone within reach;with family/visitor present         Time: 1120-1133 PT Time Calculation (min): 13 min   Charges:   PT Evaluation $Initial PT Evaluation Tier I: 1 Procedure PT Treatments $Gait Training: 8-22 mins   PT G Codes:          Amber AlertJannie Quandra Newman, MPT Pager: [phone]

## 2013-10-29 NOTE — Progress Notes (Signed)
IR PA aware of request for image guided left supraclavicular mass biopsy. Images reviewed by Dr. Archer AsaMcCullough today, will proceed with biopsy today in ultrasound with local anesthetic. Consent obtained and in chart.  Pattricia BossKoreen Deondrea Markos PA-C Interventional Radiology  10/29/13  12:13 PM

## 2013-10-29 NOTE — Evaluation (Addendum)
SLP Cancellation Note  Patient Details Name: Amber Newman MRN: [id number] DOB: 05/06/1956   Cancelled treatment:       Reason Eval/Treat Not Completed: Other (comment) (pt npo for procedure - phoned MD who clarified order - MD requested SLP to see in am for bedside swallow )  SLP phoned [facility] rehab dept and relayed message to speech therapist who will be working tomorrow.     Donavan Burnetamara Malaisha Silliman, MS [facility] SLP [phone]

## 2013-10-29 NOTE — Care Management Note (Signed)
UR complete    Amber Gillson,MSN,RN [phone] 

## 2013-10-29 NOTE — Consult Note (Addendum)
[place]  Telephone:(336) [place]                                MR#: [id number]  DOB: [birth year]                       CSN#: [id number]  Referring MD: Dr. Sheliah [facility]  Primary MD: Windy Carina.   Reason for Consult:  Rule out Lymphoma   AJL:UNGBMB R Gribble is a 58 y.o. female admitted on 3/26 after 2 visits to [place] office within the last 9 days with acute left neck welling and pain and fever up to 102 , first believed to be sinusitis infection and given Levaquin, and the second time with left neck pain extending to the scapula without relief and given clindamycin 3 days prior to admission. On 3/26, she was seen at the ED. She had fevers, chills, cough, congestion and dysphagia with solid and liquids. She also noted some dysarthria and her gait sowed some instability, and tingling on the right digits. No visual abnormalities. CT of the head without contrast was negative. A CT of the neck however, revealed dffuse submandibular and diffuse cervical lymph nodes ,innumerable. A Lymph node measuring up to 11 mm wasnoted. A large ill-defined left lower neck/ left supraclavicular mass with associated severe edema was seen. This occupies the almost the entire left lower neck and supraclavicular space and measures in AP diameter approximately 8 cm. This mass is associated with prominent subcutaneous edema. There is associated prevertebral soft tissue thickening and possible involvement No acute cervical spine bony abnormalities identified. Dr. [name], ENT was requested to consult the patient, this is pending at the time of our evaluation.   MRI of the brain on 3/27 shows  No acute intracranial abnormalities.  Mild prominence of the retropharyngeal soft tissues which relates to the supraclavicular mass.  Asymmetric prominence of the left lacrimal gland which appears enlarged.These findings are worrisome for lymphoma. No other  staging CTs are available for revie, although they were ordered.  No tissue diagnosis was obtained to date but she states that she is to have biopsy this afternoon. Sed Rate 96. Hb 10.5. Hepatitis panel negative. HIV non reactive. Pending: EBV, LDH.  Patient's mother has a history of NHL Angelita Ingles Leys, patient of Dr. Marin [name])  Never had a bone marrow biopsy in the past.  We were requested to see this patient with recommendations.        PMH:  Past Medical History  Diagnosis Date  . Hypertension   . Diabetes mellitus without complication   . HSV infection   . HTN (hypertension) 10/28/2013  . Diabetes mellitus type 2, diet-controlled 10/28/2013    Surgeries:  Past Surgical History  Procedure Laterality Date  . Tonsillectomy    . Eye surgery      Allergies:  Allergies  Allergen Reactions  . Codeine Rash  . Latex Rash  . Penicillins Rash  . Sulfa Antibiotics Rash    Medications:   Prior to Admission:  Prescriptions prior to admission  Medication Sig Dispense Refill  . albuterol (PROVENTIL HFA;VENTOLIN HFA) 108 (90 BASE) MCG/ACT inhaler Inhale 2 puffs into the lungs every 4 (four) hours as needed for shortness of breath (cough).      . clindamycin (CLEOCIN) 300 MG capsule Take 300 mg by mouth 3 (three)  times daily.      . cyclobenzaprine (FLEXERIL) 5 MG tablet Take 5-10 mg by mouth 3 (three) times daily as needed for muscle spasms.      . pseudoephedrine (SUDAFED) 30 MG tablet Take 30 mg by mouth every 4 (four) hours as needed for congestion.      Marland Kitchen amLODipine (NORVASC) 10 MG tablet Take 10 mg by mouth daily.        WPV:XYIAXK chloride, acetaminophen, acetaminophen, albuterol, alum & mag hydroxide-simeth, ipratropium, magnesium citrate, morphine injection, ondansetron (ZOFRAN) IV, ondansetron, oxyCODONE, polyethylene glycol, sodium chloride, sorbitol  ROS: See HPI for significant positives. Dysphagia better controlled with Decadron given for swelling. Otherwise,  negative  Family History:    Positive for lymphoma in her mother, several members with colon and breast cancer   Social History:  reports that she has never smoked. She does not have any smokeless tobacco history on file. She reports that she does not drink alcohol. No recreational drugs. Single. No children.Lives in [place]. She  Works in [employer].   Physical Exam    Filed Vitals:   10/29/13 0700  BP:   Pulse:   Temp: 98.6 F (37 C)  Resp:      Filed Weights   10/29/13 0041 10/29/13 0600  Weight: 246 lb 11.1 oz (111.9 kg) 246 lb 4.1 oz (111.7 kg)   General: 61  -year-old  in no acute distress A. and O. x3  well-developed, well-nourished.  HEENT: Normocephalic, atraumatic, PERRLA. Sclerae anicteric. Oral cavity without thrush or lesions. NECK remarkable for a palpable fullness on the left supraclavicular region LUNGS: clear bilaterally . No wheezing, rhonchi or rales. No axillary masses. BREASTS: not examined. CARDIOVASCULAR: regular rate and rhythm,no murmur , rubs or gallops ABDOMEN: soft, nontender, bowel sounds x4. No HSM. No masses palpable.  GU/rectal: deferred. EXTREMITIES: no clubbing cyanosis or edema. No bruising or petechial rash MUSCULOSKELETAL: no spinal tenderness.  NEURO: Non Focal. No Horner's.   Labs:  CBC   Recent Labs Lab 10/28/13 1716 10/29/13 0125  WBC 8.9 7.8  HGB 11.5* 10.5*  HCT 34.0* 31.3*  PLT 269 270  MCV 79.8 80.1  MCH 27.0 26.9  MCHC 33.8 33.5  RDW 14.1 14.3  LYMPHSABS 2.0  --   MONOABS 0.8  --   EOSABS 0.2  --   BASOSABS 0.1  --      CMP    Recent Labs Lab 10/28/13 1716 10/29/13 0125  NA 135* 136*  K 4.0 4.0  CL 101 101  CO2 25 24  GLUCOSE 118* 152*  BUN 7 8  CREATININE 0.82 0.82  CALCIUM 9.1 8.8  MG  --  1.9  AST  --  19  18  ALT  --  24  25  ALKPHOS  --  99  100  BILITOT  --  0.5  0.5        Component Value Date/Time   BILITOT 0.5 10/29/2013 0125   BILITOT 0.5 10/29/2013 0125    BILIDIR <0.2 10/29/2013 0125   IBILI NOT CALCULATED 10/29/2013 0125      Recent Labs Lab 10/29/13 0125  INR 1.14    No results found for this basename: DDIMER,  in the last 72 hours   Anemia panel:   Recent Labs  10/28/13 2320  VITAMINB12 >2000*  FOLATE >20.0  FERRITIN 326*  TIBC 267  IRON 29*     Imaging Studies:  Dg Chest 2 View  10/29/2013   CLINICAL DATA:  Chest pain.  History of hypertension.  EXAM: CHEST  2 VIEW  COMPARISON:  No priors.  FINDINGS: Lung volumes are normal. No consolidative airspace disease. No pleural effusions. No pneumothorax. No pulmonary nodule or mass noted. Pulmonary vasculature and the cardiomediastinal silhouette are within normal limits.  IMPRESSION: 1.  No radiographic evidence of acute cardiopulmonary disease.   Electronically Signed   By: Vinnie Langton M.D.   On: 10/29/2013 00:00   Dg Cervical Spine Complete  10/28/2013   CLINICAL DATA:  Neck pain and stiffness  EXAM: CERVICAL SPINE  4+ VIEWS  COMPARISON:  None.  FINDINGS: There is no evidence of cervical spine fracture. The prevertebral soft tissues are thickened. There is loss of normal lordosis of cervical spine spine with straightening of cervical spine. No other significant bone abnormalities are identified.  IMPRESSION: Thickening of prevertebral soft tissues. Further evaluation with a cervical spine CT is recommended. Loss of normal lordosis of cervical spine with straightening of cervical spine which could be positional or due to muscle spasm.   Electronically Signed   By: Abelardo Diesel M.D.   On: 10/28/2013 16:03   Ct Head Wo Contrast  10/28/2013   CLINICAL DATA:  Expressive aphasia  EXAM: CT HEAD WITHOUT CONTRAST  TECHNIQUE: Contiguous axial images were obtained from the base of the skull through the vertex without intravenous contrast.  COMPARISON:  None.  FINDINGS: The ventricles are normal in size and configuration. No extra-axial fluid collections are identified. The gray-white  differentiation is normal. No CT findings for acute intracranial process such as hemorrhage or infarction. No mass lesions. The brainstem and cerebellum are grossly normal.  The bony structures are intact. The paranasal sinuses and mastoid air cells are clear. The globes are intact.  IMPRESSION: No acute intracranial findings or mass lesion.   Electronically Signed   By: Kalman Jewels M.D.   On: 10/28/2013 17:13   Ct Soft Tissue Neck W Contrast  10/28/2013   CLINICAL DATA:  Severe pain.  Neck mass.  EXAM: CT NECK WITH CONTRAST  TECHNIQUE: Multidetector CT imaging of the neck was performed using the standard protocol following the bolus administration of intravenous contrast.  CONTRAST:  161m OMNIPAQUE IOHEXOL 300 MG/ML  SOLN  COMPARISON:  CT HEAD W/O CM dated 10/28/2013; DG CERVICAL SPINE COMPLETE dated 10/28/2013  FINDINGS: Mucous retention cyst left maxillary sinus. Nasopharynx is clear. Visualized portion of the tongue are unremarkable. Oropharynx and larynx are unremarkable. Trachea is widely patent. Pulmonary apices are clear. Salivary glands are unremarkable. Diffuse submandibular and diffuse cervical lymph nodes are present. These are innumerable. Lymph node measuring up to 11 mm in transverse diameter noted. A large ill-defined left lower neck/ left supraclavicular mass with associated severe edema noted. This occupies the almost the entire left lower neck and supraclavicular space and measures in AP diameter approximately 8 cm. This mass is associated with prominent subcutaneous edema. There is associated prevertebral soft tissue thickening and possible involvement. The adjacent subclavian artery and vein appear to be patent. Left internal jugular vein and left common carotid artery are patent. Given the absence of a history of direct trauma or infectious symptoms, this ill-defined mass is concerning for a malignancy. No acute cervical spine bony abnormalities identified. Straightening of cervical spine  is most likely related to adjacent soft tissue process and/or positioning. Evaluation by Oncology suggested. PET-CT can be obtained for further evaluation. Mammography should also be considered. These results were called by telephone at the time of interpretation on  10/28/2013 at 6:54 PM to Dr. Alvina Chou , who verbally acknowledged these results.  IMPRESSION: Large ill-defined left supraclavicular and left lower neck mass with associated extensive cervical lymph nodes and edema. Given the patient's history this is concerning for a malignancy. Evaluation by Oncology suggested. PET-CT can be obtained for further evaluation. Mammography should also be considered.   Electronically Signed   By: Marcello Moores  Register   On: 10/28/2013 18:56   Mr Jeri Cos Wo Contrast  10/29/2013   CLINICAL DATA:  Abnormal speech. Supraclavicular mass. Possible CVA. Evaluate for intracranial lesion.  EXAM: MRI HEAD WITHOUT AND WITH CONTRAST  TECHNIQUE: Multiplanar, multiecho pulse sequences of the brain and surrounding structures were obtained without and with intravenous contrast.  CONTRAST:  76m MULTIHANCE GADOBENATE DIMEGLUMINE 529 MG/ML IV SOLN  COMPARISON:  CT NECK W/CM dated 10/28/2013; CT HEAD W/O CM dated 10/28/2013  FINDINGS: No evidence for acute infarction, hemorrhage, mass lesion, hydrocephalus, or extra-axial fluid. Slight premature cerebral and cerebellar atrophy. Mild subcortical and periventricular T2 and FLAIR hyperintensities, likely chronic microvascular ischemic change. Flow voids are maintained throughout the carotid, basilar, and vertebral arteries. There are no areas of chronic hemorrhage.  Sagittal images reveal mild prominence of the retropharyngeal soft tissues from C3 and above. Mild nasopharyngeal adenoidal hypertrophy. No osseous abnormalities.  Post infusion, no abnormal enhancement of the brain or meninges. Slight prominence of the epidural veins upper cervical region. Widespread cervical adenopathy. No  acute sinus or mastoid disease.  There is asymmetric prominence of left lacrimal gland which appears enlarged. No intraconal mass.  IMPRESSION: Mild atrophy and small vessel disease. No acute intracranial abnormalities.  Mild prominence of the retropharyngeal soft tissues. I doubt this is an abscess, but relates to the supraclavicular mass.  Asymmetric prominence of the left lacrimal gland which appears enlarged. In the setting of widespread lymphadenopathy, and a large supraclavicular mass (possible extracapsular spread?), the findings may represent non-Hodgkin's lymphoma. Tissue sampling of an accessible lymph node may be warranted, along with additional CT chest, abdomen, and pelvis imaging.   Electronically Signed   By: JRolla FlattenM.D.   On: 10/29/2013 08:37      A/P: 58y.o. female  Asked to see for evaluation of neck/ supraclavicular area fullness worrisome for lymphoma. Tissue diagnosis needs to be obtained as soon as possible for further plans.  CT of the C/A/P pending. LDH  Pending. We were kindly requested to evaluate the patient.  Dr.  EMarin [name] is to see the patient following this consult with recommendations regarding diagnosis, treatment options and further workup studies. Addendum to this note to be written. Thank you for the referral.  WRondel Jumbo PA-C 10/29/2013 11:17 AM    ADDENDUM:  I agree with the above assessment. I have actually taking care of her mother who had diffuse large cell lymphoma. She is basically has been cured. She's been in remission now for close to 10 years.  The certainly could be non-Hodgkin's lymphoma. This also could be Hodgkin's disease.  The CT of her chest abdomen and pelvis does show much lymphadenopathy from what I recall.  Her LDH is normal. Her repeat PTT was normal.  She does have, in my opinion, iron deficiency. She is already through the. menopause. We probably need to check her stools for blood.  We will have to see what the pathology  shows. This will be the key for further studies and therapy options.  On my exam, I really cannot find anything that was specific.  It was very nice to see Ms. Faison. She certainly does look like her mother.  We will follow along and make additional recommendations.  Pete E.  [place] 4:13

## 2013-10-29 NOTE — Consult Note (Signed)
Reason for Consult: Neck mass Referring Physician: Louellen Molder, MD  Amber Newman is an 58 y.o. female.  HPI: One-week history of large left supraclavicular mass. History of fever and unexplained chills recently. History of significant fatigue. Family history of lymphoma. Admitted last night, CT reveals large lower neck mass.  Past Medical History  Diagnosis Date  . Hypertension   . Diabetes mellitus without complication   . HSV infection   . HTN (hypertension) 10/28/2013  . Diabetes mellitus type 2, diet-controlled 10/28/2013    Past Surgical History  Procedure Laterality Date  . Tonsillectomy    . Eye surgery      History reviewed. No pertinent family history.  Social History:  reports that she has never smoked. She does not have any smokeless tobacco history on file. She reports that she does not drink alcohol. Her drug history is not on file.  Allergies:  Allergies  Allergen Reactions  . Codeine Rash  . Latex Rash  . Penicillins Rash  . Sulfa Antibiotics Rash    Medications: Reviewed  Results for orders placed during the hospital encounter of 10/28/13 (from the past 48 hour(s))  CBC WITH DIFFERENTIAL     Status: Abnormal   Collection Time    10/28/13  5:16 PM      Result Value Ref Range   WBC 8.9  4.0 - 10.5 K/uL   RBC 4.26  3.87 - 5.11 MIL/uL   Hemoglobin 11.5 (*) 12.0 - 15.0 g/dL   HCT 34.0 (*) 36.0 - 46.0 %   MCV 79.8  78.0 - 100.0 fL   MCH 27.0  26.0 - 34.0 pg   MCHC 33.8  30.0 - 36.0 g/dL   RDW 14.1  11.5 - 15.5 %   Platelets 269  150 - 400 K/uL   Neutrophils Relative % 65  43 - 77 %   Lymphocytes Relative 23  12 - 46 %   Monocytes Relative 9  3 - 12 %   Eosinophils Relative 2  0 - 5 %   Basophils Relative 1  0 - 1 %   Neutro Abs 5.8  1.7 - 7.7 K/uL   Lymphs Abs 2.0  0.7 - 4.0 K/uL   Monocytes Absolute 0.8  0.1 - 1.0 K/uL   Eosinophils Absolute 0.2  0.0 - 0.7 K/uL   Basophils Absolute 0.1  0.0 - 0.1 K/uL   Smear Review MORPHOLOGY UNREMARKABLE     BASIC METABOLIC PANEL     Status: Abnormal   Collection Time    10/28/13  5:16 PM      Result Value Ref Range   Sodium 135 (*) 137 - 147 mEq/L   Potassium 4.0  3.7 - 5.3 mEq/L   Chloride 101  96 - 112 mEq/L   CO2 25  19 - 32 mEq/L   Glucose, Bld 118 (*) 70 - 99 mg/dL   BUN 7  6 - 23 mg/dL   Creatinine, Ser 0.82  0.50 - 1.10 mg/dL   Calcium 9.1  8.4 - 10.5 mg/dL   GFR calc non Af Amer 79 (*) >90 mL/min   GFR calc Af Amer >90  >90 mL/min   Comment: (NOTE)     The eGFR has been calculated using the CKD EPI equation.     This calculation has not been validated in all clinical situations.     eGFR's persistently <90 mL/min signify possible Chronic Kidney     Disease.  VITAMIN B12  Status: Abnormal   Collection Time    10/28/13 11:20 PM      Result Value Ref Range   Vitamin B-12 >2000 (*) 211 - 911 pg/mL   Comment: Performed at [facility]     Status: None   Collection Time    10/28/13 11:20 PM      Result Value Ref Range   Folate >20.0     Comment: (NOTE)     Reference Ranges            Deficient:       0.4 - 3.3 ng/mL            Indeterminate:   3.4 - 5.4 ng/mL            Normal:              > 5.4 ng/mL     Performed at [facility]     Status: Abnormal   Collection Time    10/28/13 11:20 PM      Result Value Ref Range   Iron 29 (*) 42 - 135 ug/dL   TIBC 267  250 - 470 ug/dL   Saturation Ratios 11 (*) 20 - 55 %   UIBC 238  125 - 400 ug/dL   Comment: Performed at [facility]     Status: Abnormal   Collection Time    10/28/13 11:20 PM      Result Value Ref Range   Ferritin 326 (*) 10 - 291 ng/mL   Comment: Performed at [facility] A1C     Status: Abnormal   Collection Time    10/28/13 11:27 PM      Result Value Ref Range   Hemoglobin A1C 7.6 (*) <5.7 %   Comment: (NOTE)                                                                               According to the ADA Clinical  Practice Recommendations for 2011, when     HbA1c is used as a screening test:      >=6.5%   Diagnostic of Diabetes Mellitus               (if abnormal result is confirmed)     5.7-6.4%   Increased risk of developing Diabetes Mellitus     References:Diagnosis and Classification of Diabetes Mellitus,Diabetes     EVOJ,5009,38(HWEXH 1):S62-S69 and Standards of Medical Care in             Diabetes - 2011,Diabetes Care,2011,34 (Suppl 1):S11-S61.   Mean Plasma Glucose 171 (*) <117 mg/dL   Comment: Performed at [facility] MICROSCOPIC     Status: Abnormal   Collection Time    10/29/13 12:20 AM      Result Value Ref Range   Color, Urine YELLOW  YELLOW   APPearance CLEAR  CLEAR   Specific Gravity, Urine >1.046 (*) 1.005 - 1.030   pH 6.5  5.0 - 8.0   Glucose, UA NEGATIVE  NEGATIVE mg/dL   Hgb urine dipstick NEGATIVE  NEGATIVE   Bilirubin Urine  NEGATIVE  NEGATIVE   Ketones, ur NEGATIVE  NEGATIVE mg/dL   Protein, ur NEGATIVE  NEGATIVE mg/dL   Urobilinogen, UA 0.2  0.0 - 1.0 mg/dL   Nitrite NEGATIVE  NEGATIVE   Leukocytes, UA SMALL (*) NEGATIVE  URINE MICROSCOPIC-ADD ON     Status: Abnormal   Collection Time    10/29/13 12:20 AM      Result Value Ref Range   Squamous Epithelial / LPF RARE  RARE   WBC, UA 3-6  <3 WBC/hpf   RBC / HPF 3-6  <3 RBC/hpf   Bacteria, UA FEW (*) RARE   Urine-Other FEW YEAST    MRSA PCR SCREENING     Status: None   Collection Time    10/29/13 12:27 AM      Result Value Ref Range   MRSA by PCR NEGATIVE  NEGATIVE   Comment:            The GeneXpert MRSA Assay (FDA     approved for NASAL specimens     only), is one component of a     comprehensive MRSA colonization     surveillance program. It is not     intended to diagnose MRSA     infection nor to guide or     monitor treatment for     MRSA infections.  HEPATIC FUNCTION PANEL     Status: Abnormal   Collection Time    10/29/13  1:25 AM      Result Value Ref Range    Total Protein 8.6 (*) 6.0 - 8.3 g/dL   Albumin 2.5 (*) 3.5 - 5.2 g/dL   AST 19  0 - 37 U/L   ALT 24  0 - 35 U/L   Alkaline Phosphatase 99  39 - 117 U/L   Total Bilirubin 0.5  0.3 - 1.2 mg/dL   Bilirubin, Direct <0.2  0.0 - 0.3 mg/dL   Indirect Bilirubin NOT CALCULATED  0.3 - 0.9 mg/dL  MAGNESIUM     Status: None   Collection Time    10/29/13  1:25 AM      Result Value Ref Range   Magnesium 1.9  1.5 - 2.5 mg/dL  PROTIME-INR     Status: None   Collection Time    10/29/13  1:25 AM      Result Value Ref Range   Prothrombin Time 14.4  11.6 - 15.2 seconds   INR 1.14  0.00 - 1.49  LACTATE DEHYDROGENASE     Status: None   Collection Time    10/29/13  1:25 AM      Result Value Ref Range   LDH 202  94 - 250 U/L  SEDIMENTATION RATE     Status: Abnormal   Collection Time    10/29/13  1:25 AM      Result Value Ref Range   Sed Rate 96 (*) 0 - 22 mm/hr  HEPATITIS PANEL, ACUTE     Status: None   Collection Time    10/29/13  1:25 AM      Result Value Ref Range   Hepatitis B Surface Ag NEGATIVE  NEGATIVE   HCV Ab NEGATIVE  NEGATIVE   Hep A IgM NON REACTIVE  NON REACTIVE   Hep B C IgM NON REACTIVE  NON REACTIVE   Comment: (NOTE)     High levels of Hepatitis B Core IgM antibody are detectable     during the acute stage of Hepatitis B. This antibody is used  to differentiate current from past HBV infection.     Performed at [facility]  HIV ANTIBODY (ROUTINE TESTING)     Status: None   Collection Time    10/29/13  1:25 AM      Result Value Ref Range   HIV NON REACTIVE  NON REACTIVE   Comment: (NOTE)     Effective November 08, 2013, [facility] will no longer offer the     current 3rd Generation HIV diagnostic screening assay, HIV Antibodies,     HIV-1/2 EIA, with reflexes. At that time, [facility] will     only offer HIV-1/2 Ag/Ab, 4th Gen, w/ Reflexes as recommended by the     CDC. This HIV diagnostic screening assay tests for antibodies to HIV-1     and  HIV-2 as well as HIV p24 antigen and provides greater sensitivity     for the detection of recent infection. Any orders for the 3rd     Generation assay will automatically be referred to the 4th Generation     assay.     Performed at [facility]  APTT     Status: Abnormal   Collection Time    10/29/13  1:25 AM      Result Value Ref Range   aPTT 64 (*) 24 - 37 seconds   Comment:            IF BASELINE aPTT IS ELEVATED,     SUGGEST PATIENT RISK ASSESSMENT     BE USED TO DETERMINE APPROPRIATE     ANTICOAGULANT THERAPY.  CBC     Status: Abnormal   Collection Time    10/29/13  1:25 AM      Result Value Ref Range   WBC 7.8  4.0 - 10.5 K/uL   RBC 3.91  3.87 - 5.11 MIL/uL   Hemoglobin 10.5 (*) 12.0 - 15.0 g/dL   HCT 31.3 (*) 36.0 - 46.0 %   MCV 80.1  78.0 - 100.0 fL   MCH 26.9  26.0 - 34.0 pg   MCHC 33.5  30.0 - 36.0 g/dL   RDW 14.3  11.5 - 15.5 %   Platelets 270  150 - 400 K/uL  COMPREHENSIVE METABOLIC PANEL     Status: Abnormal   Collection Time    10/29/13  1:25 AM      Result Value Ref Range   Sodium 136 (*) 137 - 147 mEq/L   Potassium 4.0  3.7 - 5.3 mEq/L   Chloride 101  96 - 112 mEq/L   CO2 24  19 - 32 mEq/L   Glucose, Bld 152 (*) 70 - 99 mg/dL   BUN 8  6 - 23 mg/dL   Creatinine, Ser 0.82  0.50 - 1.10 mg/dL   Calcium 8.8  8.4 - 10.5 mg/dL   Total Protein 8.9 (*) 6.0 - 8.3 g/dL   Albumin 2.5 (*) 3.5 - 5.2 g/dL   AST 18  0 - 37 U/L   ALT 25  0 - 35 U/L   Alkaline Phosphatase 100  39 - 117 U/L   Total Bilirubin 0.5  0.3 - 1.2 mg/dL   GFR calc non Af Amer 79 (*) >90 mL/min   GFR calc Af Amer >90  >90 mL/min   Comment: (NOTE)     The eGFR has been calculated using the CKD EPI equation.     This calculation has not been validated in all clinical situations.  eGFR's persistently <90 mL/min signify possible Chronic Kidney     Disease.  GLUCOSE, CAPILLARY     Status: Abnormal   Collection Time    10/29/13  7:59 AM      Result Value Ref Range    Glucose-Capillary 152 (*) 70 - 99 mg/dL   Comment 1 Notify RN    APTT     Status: None   Collection Time    10/29/13 10:59 AM      Result Value Ref Range   aPTT 30  24 - 37 seconds  GLUCOSE, CAPILLARY     Status: Abnormal   Collection Time    10/29/13 11:20 AM      Result Value Ref Range   Glucose-Capillary 151 (*) 70 - 99 mg/dL   Comment 1 Notify RN      Dg Chest 2 View  10/29/2013   CLINICAL DATA:  Chest pain.  History of hypertension.  EXAM: CHEST  2 VIEW  COMPARISON:  No priors.  FINDINGS: Lung volumes are normal. No consolidative airspace disease. No pleural effusions. No pneumothorax. No pulmonary nodule or mass noted. Pulmonary vasculature and the cardiomediastinal silhouette are within normal limits.  IMPRESSION: 1.  No radiographic evidence of acute cardiopulmonary disease.   Electronically Signed   By: Vinnie Langton M.D.   On: 10/29/2013 00:00   Dg Cervical Spine Complete  10/28/2013   CLINICAL DATA:  Neck pain and stiffness  EXAM: CERVICAL SPINE  4+ VIEWS  COMPARISON:  None.  FINDINGS: There is no evidence of cervical spine fracture. The prevertebral soft tissues are thickened. There is loss of normal lordosis of cervical spine spine with straightening of cervical spine. No other significant bone abnormalities are identified.  IMPRESSION: Thickening of prevertebral soft tissues. Further evaluation with a cervical spine CT is recommended. Loss of normal lordosis of cervical spine with straightening of cervical spine which could be positional or due to muscle spasm.   Electronically Signed   By: Abelardo Diesel M.D.   On: 10/28/2013 16:03   Ct Head Wo Contrast  10/28/2013   CLINICAL DATA:  Expressive aphasia  EXAM: CT HEAD WITHOUT CONTRAST  TECHNIQUE: Contiguous axial images were obtained from the base of the skull through the vertex without intravenous contrast.  COMPARISON:  None.  FINDINGS: The ventricles are normal in size and configuration. No extra-axial fluid collections are  identified. The gray-white differentiation is normal. No CT findings for acute intracranial process such as hemorrhage or infarction. No mass lesions. The brainstem and cerebellum are grossly normal.  The bony structures are intact. The paranasal sinuses and mastoid air cells are clear. The globes are intact.  IMPRESSION: No acute intracranial findings or mass lesion.   Electronically Signed   By: Kalman Jewels M.D.   On: 10/28/2013 17:13   Ct Soft Tissue Neck W Contrast  10/28/2013   CLINICAL DATA:  Severe pain.  Neck mass.  EXAM: CT NECK WITH CONTRAST  TECHNIQUE: Multidetector CT imaging of the neck was performed using the standard protocol following the bolus administration of intravenous contrast.  CONTRAST:  121m OMNIPAQUE IOHEXOL 300 MG/ML  SOLN  COMPARISON:  CT HEAD W/O CM dated 10/28/2013; DG CERVICAL SPINE COMPLETE dated 10/28/2013  FINDINGS: Mucous retention cyst left maxillary sinus. Nasopharynx is clear. Visualized portion of the tongue are unremarkable. Oropharynx and larynx are unremarkable. Trachea is widely patent. Pulmonary apices are clear. Salivary glands are unremarkable. Diffuse submandibular and diffuse cervical lymph nodes are present. These are innumerable.  Lymph node measuring up to 11 mm in transverse diameter noted. A large ill-defined left lower neck/ left supraclavicular mass with associated severe edema noted. This occupies the almost the entire left lower neck and supraclavicular space and measures in AP diameter approximately 8 cm. This mass is associated with prominent subcutaneous edema. There is associated prevertebral soft tissue thickening and possible involvement. The adjacent subclavian artery and vein appear to be patent. Left internal jugular vein and left common carotid artery are patent. Given the absence of a history of direct trauma or infectious symptoms, this ill-defined mass is concerning for a malignancy. No acute cervical spine bony abnormalities identified.  Straightening of cervical spine is most likely related to adjacent soft tissue process and/or positioning. Evaluation by Oncology suggested. PET-CT can be obtained for further evaluation. Mammography should also be considered. These results were called by telephone at the time of interpretation on 10/28/2013 at 6:54 PM to Dr. Alvina Chou , who verbally acknowledged these results.  IMPRESSION: Large ill-defined left supraclavicular and left lower neck mass with associated extensive cervical lymph nodes and edema. Given the patient's history this is concerning for a malignancy. Evaluation by Oncology suggested. PET-CT can be obtained for further evaluation. Mammography should also be considered.   Electronically Signed   By: Marcello Moores  Register   On: 10/28/2013 18:56   Ct Chest W Contrast  10/29/2013   CLINICAL DATA:  Left neck mass with swelling and shoulder pain.  EXAM: CT CHEST, ABDOMEN, AND PELVIS WITH CONTRAST  TECHNIQUE: Multidetector CT imaging of the chest, abdomen and pelvis was performed following the standard protocol during bolus administration of intravenous contrast.  CONTRAST:  154m OMNIPAQUE IOHEXOL 300 MG/ML  SOLN  COMPARISON:  CT NECK W/CM dated 10/28/2013; CT HEAD W/O CM dated 10/28/2013  FINDINGS: CT CHEST FINDINGS  There is ill-defined and somewhat infiltrative appearing intermediate density in the left supraclavicular fossa, incompletely imaged. There are adjacent lymph nodes, better delineated on yesterday's exam. Stranding extends into the left superior mediastinum. No pathologically enlarged mediastinal, hilar or axillary lymph nodes. Mediastinal lymph nodes are subcentimeter in short axis size. No hilar or axillary adenopathy. Specifically, left axillary and subpectoral lymph nodes measure up to 10 mm in short axis. Heart size normal. No pericardial effusion.  Trace left pleural fluid. Minimal subpleural atelectasis in the left lower lobe. Lungs are otherwise clear. Airway is  unremarkable.  CT ABDOMEN AND PELVIS FINDINGS  Liver is unremarkable. Intermediate attenuation material seen in the gallbladder may represent sludge. Adrenal glands, kidneys, spleen, pancreas, stomach and bowel are unremarkable. Peripherally calcified lesion in the uterus measures 4.8 cm and is indicative of a fibroid. Ovaries are visualized. No pathologically enlarged lymph nodes. No free fluid. No worrisome lytic or sclerotic lesions.  IMPRESSION: 1. Ill-defined and somewhat infiltrative appearing intermediate density attenuation in the left supraclavicular fossa, incompletely imaged. Please refer to neck CT done yesterday for further details and discussion. Inflammatory changes appear to extend to the superior aspect of the left mediastinum. 2. No evidence of primary malignancy or metastatic disease in the chest, abdomen or pelvis. 3. Trace left pleural fluid.   Electronically Signed   By: MLorin PicketM.D.   On: 10/29/2013 13:45   Mr BJeri CosWBJContrast  10/29/2013   CLINICAL DATA:  Abnormal speech. Supraclavicular mass. Possible CVA. Evaluate for intracranial lesion.  EXAM: MRI HEAD WITHOUT AND WITH CONTRAST  TECHNIQUE: Multiplanar, multiecho pulse sequences of the brain and surrounding structures were obtained without and with  intravenous contrast.  CONTRAST:  61m MULTIHANCE GADOBENATE DIMEGLUMINE 529 MG/ML IV SOLN  COMPARISON:  CT NECK W/CM dated 10/28/2013; CT HEAD W/O CM dated 10/28/2013  FINDINGS: No evidence for acute infarction, hemorrhage, mass lesion, hydrocephalus, or extra-axial fluid. Slight premature cerebral and cerebellar atrophy. Mild subcortical and periventricular T2 and FLAIR hyperintensities, likely chronic microvascular ischemic change. Flow voids are maintained throughout the carotid, basilar, and vertebral arteries. There are no areas of chronic hemorrhage.  Sagittal images reveal mild prominence of the retropharyngeal soft tissues from C3 and above. Mild nasopharyngeal adenoidal  hypertrophy. No osseous abnormalities.  Post infusion, no abnormal enhancement of the brain or meninges. Slight prominence of the epidural veins upper cervical region. Widespread cervical adenopathy. No acute sinus or mastoid disease.  There is asymmetric prominence of left lacrimal gland which appears enlarged. No intraconal mass.  IMPRESSION: Mild atrophy and small vessel disease. No acute intracranial abnormalities.  Mild prominence of the retropharyngeal soft tissues. I doubt this is an abscess, but relates to the supraclavicular mass.  Asymmetric prominence of the left lacrimal gland which appears enlarged. In the setting of widespread lymphadenopathy, and a large supraclavicular mass (possible extracapsular spread?), the findings may represent non-Hodgkin's lymphoma. Tissue sampling of an accessible lymph node may be warranted, along with additional CT chest, abdomen, and pelvis imaging.   Electronically Signed   By: JRolla FlattenM.D.   On: 10/29/2013 08:37   Ct Abdomen Pelvis W Contrast  10/29/2013   CLINICAL DATA:  Left neck mass with swelling and shoulder pain.  EXAM: CT CHEST, ABDOMEN, AND PELVIS WITH CONTRAST  TECHNIQUE: Multidetector CT imaging of the chest, abdomen and pelvis was performed following the standard protocol during bolus administration of intravenous contrast.  CONTRAST:  1042mOMNIPAQUE IOHEXOL 300 MG/ML  SOLN  COMPARISON:  CT NECK W/CM dated 10/28/2013; CT HEAD W/O CM dated 10/28/2013  FINDINGS: CT CHEST FINDINGS  There is ill-defined and somewhat infiltrative appearing intermediate density in the left supraclavicular fossa, incompletely imaged. There are adjacent lymph nodes, better delineated on yesterday's exam. Stranding extends into the left superior mediastinum. No pathologically enlarged mediastinal, hilar or axillary lymph nodes. Mediastinal lymph nodes are subcentimeter in short axis size. No hilar or axillary adenopathy. Specifically, left axillary and subpectoral lymph nodes  measure up to 10 mm in short axis. Heart size normal. No pericardial effusion.  Trace left pleural fluid. Minimal subpleural atelectasis in the left lower lobe. Lungs are otherwise clear. Airway is unremarkable.  CT ABDOMEN AND PELVIS FINDINGS  Liver is unremarkable. Intermediate attenuation material seen in the gallbladder may represent sludge. Adrenal glands, kidneys, spleen, pancreas, stomach and bowel are unremarkable. Peripherally calcified lesion in the uterus measures 4.8 cm and is indicative of a fibroid. Ovaries are visualized. No pathologically enlarged lymph nodes. No free fluid. No worrisome lytic or sclerotic lesions.  IMPRESSION: 1. Ill-defined and somewhat infiltrative appearing intermediate density attenuation in the left supraclavicular fossa, incompletely imaged. Please refer to neck CT done yesterday for further details and discussion. Inflammatory changes appear to extend to the superior aspect of the left mediastinum. 2. No evidence of primary malignancy or metastatic disease in the chest, abdomen or pelvis. 3. Trace left pleural fluid.   Electronically Signed   By: MeLorin Picket.D.   On: 10/29/2013 13:45   UsKoreaiopsy  10/29/2013   CLINICAL DATA:  [age]ear old female with relatively recent development of and abnormal, ill-defined infiltrating process in the left supraclavicular fossa which is been a minimally responsive  to IV antibiotic therapy.  EXAM: ULTRASOUND BIOPSY CORE LIVER  Date: 10/29/2013  PROCEDURE: 1. Ultrasound core biopsy Interventional Radiologist:  Criselda Peaches, MD  ANESTHESIA/SEDATION: None  TECHNIQUE: Informed consent was obtained from the patient following explanation of the procedure, risks, benefits and alternatives. The patient understands, agrees and consents for the procedure. All questions were addressed. A time out was performed.  The left supraclavicular fossa was interrogated with ultrasound. There is an irregular area of mixed echogenicity which does not  appear particularly masslike but appears something more like edema in the subcutaneous fat. There are several small lymph nodes which are unremarkable in the region. No discrete mass is identified. However, the region is palpable is a masslike abnormality and the adjacent tissues are unremarkable.  Therefore, the decision was made to proceed with biopsy. A suitable skin entry site was selected and marked. The region was prepped and draped in the standard sterile fashion using Betadine skin prep. Local anesthesia was attained by infiltration with 1% lidocaine. Under real-time sonographic guidance, multiple 18 gauge core biopsies were obtained using the bio Pince automated biopsy device. Care was taken to avoid the adjacent vascular structures. There is no evidence of a bleeding or post procedure complication.  Specimens were delivered to pathology.  IMPRESSION: 1. Ultrasound interrogation of the left supraclavicular fossa corresponds with the recent CT findings. There is a very ill-defined abnormality in the left supraclavicular fossa which does not appear particularly masslike sonographically. 2. Several core biopsies were obtained in the region of the abnormality. At least 1 biopsy sampled a very small adjacent lymph node as well. Signed,  Criselda Peaches, MD  [facility]  [facility]   Electronically Signed   By: Jacqulynn Cadet M.D.   On: 10/29/2013 17:09    TIW:[id number] except as listed in admit H&P  Blood pressure 146/72, pulse 88, temperature 98.3 F (36.8 C), temperature source Oral, resp. rate 20, height 5' 6.5" (1.689 m), weight 246 lb 4.1 oz (111.7 kg), SpO2 100.00%.  PHYSICAL EXAM: Overall appearance:  Healthy appearing, in no distress Head:  Normocephalic, atraumatic. Ears: External auditory canals are clear; tympanic membranes are intact in the middle ears are free of any effusion. Nose: External nose is healthy in appearance. Internal nasal  exam free of any lesions or obstruction. Oral Cavity:  There are no mucosal lesions or masses identified. Neuro:  No identifiable neurologic deficits. Neck: Large firm mass left supraclavicular and lower level V region, no other masses palpable.  Studies Reviewed: CT scan reviewed  Procedures: none   Assessment/Plan: Left supraclavicular mass, concerning for malignancy, possibly lymphoma. Then it was performed earlier today. If open biopsy is necessary based on the cytology, they can be done next week. She has no trouble with breathing and seems to be swallowing liquids without difficulty but has some trouble with solids. No evidence of tear or sinus disease. She may be discharged home when  clinically appropriate.  Breckyn Ticas 10/29/2013, 5:49 PM

## 2013-10-29 NOTE — Evaluation (Signed)
Occupational Therapy Evaluation Patient Details Name: Amber Newman MRN: [id number] DOB: 12/30/1955 Today's Date: 10/29/2013    History of Present Illness Pt was admitted for HTN, DM and supraclavicular mass. Biopsy pending to r/o lymphoma   Clinical Impression   Pt was admitted for the above.  We will follow her in acute for increased independence with ambulation related to adls as well as energy conservation.    Follow Up Recommendations  No OT follow up (likely)    Equipment Recommendations   (possibly 3:1 commode--pt thinking about it)    Recommendations for Other Services       Precautions / Restrictions Precautions Precautions: Fall Restrictions Weight Bearing Restrictions: No      Mobility Bed Mobility Overal bed mobility: Modified Independent                Transfers Overall transfer level: Needs assistance   Transfers: Sit to/from Stand Sit to Stand: Min guard         General transfer comment: close guard for safety.     Balance                                    ADL                     General ADL Comments: Pt is able to perform adls but gets fatiqued and work of breathing is increased when standing.  She stands at wall and props RUE to sustain hold to brush/put hair up.  Min guard to ambulate in room.  No LOB but some unsteadiness.  Pt able to perform adls with set up, but she is very independent.  Discussed energy conservation and gave her information.  Educated on shower seat vs. bench and possibly 3:1 commode to go on top of commode.  Pt wants to see what is happening with biopsy before she makes a decision     Vision                     Perception     Praxis      Pertinent Vitals/Pain Pt reports:  Some pain in neck, but not like when she came in--not rated     Hand Dominance     Extremity/Trunk Assessment Upper Extremity Assessment Upper Extremity Assessment: RUE deficits/detail RUE Deficits /  Details: pt has congential brachial plexus injury.  able to lift RUE to approximately 70 without assist then uses L to raise to level needed.  Props on wall to use it to help with hair   Lower Extremity Assessment Lower Extremity Assessment: Generalized weakness   Cervical / Trunk Assessment Cervical / Trunk Assessment: Normal   Communication Communication Communication: No difficulties   Cognition Arousal/Alertness: Awake/alert Behavior During Therapy: WFL for tasks assessed/performed Overall Cognitive Status: Within Functional Limits for tasks assessed                     General Comments       Exercises      Home Living Family/patient expects to be discharged to:: Private residence Living Arrangements: Parent   Type of Home: House Home Access: Stairs to enter Secretary/administratorntrance Stairs-Number of Steps: 6   Home Layout: One level     Bathroom Shower/Tub: Tub/shower unit Shower/tub characteristics: Engineer, building servicesCurtain Bathroom Toilet: Standard     Home Equipment: None   Additional Comments: pt pulls on  door to get up from commode.  has been sponge bathing lately      Prior Functioning/Environment Level of Independence: Independent        Comments: pt states was furniture walking    OT Diagnosis:     OT Problem List: Decreased activity tolerance;Pain;Impaired balance (sitting and/or standing)   OT Treatment/Interventions: Self-care/ADL training;DME and/or AE instruction;Patient/family education;Energy conservation    OT Goals(Current goals can be found in the care plan section) Acute Rehab OT Goals Patient Stated Goal: none stated OT Goal Formulation: With patient Time For Goal Achievement: 11/12/13 Potential to Achieve Goals: Good ADL Goals Pt Will Transfer to Toilet: with supervision;bedside commode;ambulating;regular height toilet Additional ADL Goal #1: pt will gather clothes at supervision level for adls Additional ADL Goal #2: pt will incorporate at least one  rest break, during adls, if needed without cues  OT Frequency: Min 2X/week   Barriers to D/C:            End of Session:    Activity Tolerance: Patient tolerated treatment well Patient left: in bed;with call bell/phone within reach;with nursing/sitter in room   Time: 4098-1191 OT Time Calculation (min): 20 min Charges:  OT General Charges $OT Visit: 1 Procedure OT Evaluation $Initial OT Evaluation Tier I: 1 Procedure OT Treatments $Self Care/Home Management : 8-22 mins G-Codes:    Tyliah Schlereth 11-10-13, 1:00 PM  Marica Otter, OTR/L [phone] 11/10/2013

## 2013-10-30 LAB — URINE CULTURE
COLONY COUNT: NO GROWTH
CULTURE: NO GROWTH

## 2013-10-30 LAB — IRON AND TIBC
Iron: 69 ug/dL (ref 42–135)
Saturation Ratios: 30 % (ref 20–55)
TIBC: 233 ug/dL — AB (ref 250–470)
UIBC: 164 ug/dL (ref 125–400)

## 2013-10-30 LAB — GLUCOSE, CAPILLARY
GLUCOSE-CAPILLARY: 184 mg/dL — AB (ref 70–99)
Glucose-Capillary: 143 mg/dL — ABNORMAL HIGH (ref 70–99)
Glucose-Capillary: 118 mg/dL — ABNORMAL HIGH (ref 70–99)
Glucose-Capillary: 146 mg/dL — ABNORMAL HIGH (ref 70–99)

## 2013-10-30 LAB — CBC
HCT: 32.8 % — ABNORMAL LOW (ref 36.0–46.0)
HEMOGLOBIN: 10.6 g/dL — AB (ref 12.0–15.0)
MCH: 26.4 pg (ref 26.0–34.0)
MCHC: 32.3 g/dL (ref 30.0–36.0)
MCV: 81.8 fL (ref 78.0–100.0)
PLATELETS: 314 10*3/uL (ref 150–400)
RBC: 4.01 MIL/uL (ref 3.87–5.11)
RDW: 14.2 % (ref 11.5–15.5)
WBC: 6.8 10*3/uL (ref 4.0–10.5)

## 2013-10-30 LAB — FERRITIN: FERRITIN: 276 ng/mL (ref 10–291)

## 2013-10-30 LAB — LACTATE DEHYDROGENASE: LDH: 187 U/L (ref 94–250)

## 2013-10-30 LAB — OCCULT BLOOD X 1 CARD TO LAB, STOOL: Fecal Occult Bld: NEGATIVE

## 2013-10-30 MED ORDER — FERROUS GLUCONATE 324 (38 FE) MG PO TABS
324.0000 mg | ORAL_TABLET | Freq: Two times a day (BID) | ORAL | Status: DC
  Administered 2013-10-30 – 2013-10-31 (×3): 324 mg via ORAL
  Filled 2013-10-30 (×4): qty 1

## 2013-10-30 NOTE — Progress Notes (Signed)
[facility] PROGRESS NOTE  Amber Newman FUX:[id number] DOB: 1956-02-25 DOA: 10/28/2013 PCP: Dr [name]   Brief narrative 58 y.o. female With history of HTN, diet-controlled diabetes, family history of Hodgkin's lymphoma who presents to the ED with 7-8 day history of left neck swelling and pain as well as left shoulder pain. Patient found to have large left supraclavicular mass.   Assessment/Plan:  left supraclavicular mass  concerning for  lymphoma. Patient noted to have fever with some chills with associated left supraclavicular mass with pain as well as some dysphagia, speech abnormalities, gait abnormalities and a sensation of gagging when she is laying down.  MRI brain shows no cerebral lesion. Does comments on prominent retropharyngeal soft tissue swelling ?abscess. Concern for extracapsular spread with enlarged left lacrimal gland.  LFTs normal. LDH normal, hepatitis panel negative. ESR elevated. HIV ab negative. CT of the chest, abdomen and pelvis pending.  FNA done by IR on 3/27. ENT consult appreciated. Will need excision bx if FNA inconclusive.  -oncology consult Appreciated.  dysphagia  Likely secondary to above with edema noted on CT .   Continue  clear liquids. Speech and swallow nurse  evaluated patient and recommended dys level 3 diet. Her speech appears better today. . Continue on IV Decadron.  speech abnormalities/gait ataxia  MRI unremarkable. Speech improved today. Patient slightly unsteady with PT . recommends HHPT on initial evaluation.   Anemia  iron panel suggests iron deficiency. ? Related to malignancy.  stool for occult blood negative. Will add iron supplement  hypertension  Stable. Continue home regimen   diet-controlled type 2 diabetes  sliding scale insulin. A1C of 7.6.FSG stable.  on SSI   #7 prophylaxis  PPI for GI prophylaxis. Lovenox for DVT prophylaxis.      Code Status: full code Family Communication: none at bedside Disposition  Plan: Home tomorrow if continues to improve.   Consultants:  ENT   IR  oncology  Procedures:  IR guided bx  Antibiotics:  none  HPI/Subjective: Feels better. Today. Neck pain improved. Speech improving  Objective: Filed Vitals:   10/30/13 1332  BP: 133/71  Pulse: 87  Temp: 98.5 F (36.9 C)  Resp: 18    Intake/Output Summary (Last 24 hours) at 10/30/13 1405 Last data filed at 10/30/13 1300  Gross per 24 hour  Intake    240 ml  Output   1700 ml  Net  -1460 ml   Filed Weights   10/29/13 0041 10/29/13 0600 10/30/13 0615  Weight: 111.9 kg (246 lb 11.1 oz) 111.7 kg (246 lb 4.1 oz) 110.9 kg (244 lb 7.8 oz)    Exam:   General:  Middle aged obese female in NAD.   HEENT: no pallor, moist oral mucosa, soft to firmmass over left neck with ill defined margin. Non tender, no cervical , supraclavicular or axillary LN  Cardiovascular: NS1&S2, no murmurs, rubs or gallop  Respiratory: clear b/l, no added sounds  Abdomen: soft, NT ND, BS+  Musculoskeletal: soft, NT, ND, BS+  Ext: warm, no edema   Data Reviewed: Basic Metabolic Panel:  Recent Labs Lab 10/28/13 1716 10/29/13 0125  NA 135* 136*  K 4.0 4.0  CL 101 101  CO2 25 24  GLUCOSE 118* 152*  BUN 7 8  CREATININE 0.82 0.82  CALCIUM 9.1 8.8  MG  --  1.9   Liver Function Tests:  Recent Labs Lab 10/29/13 0125  AST 19  18  ALT 24  25  ALKPHOS 99  100  BILITOT 0.5  0.5  PROT 8.6*  8.9*  ALBUMIN 2.5*  2.5*   No results found for this basename: LIPASE, AMYLASE,  in the last 168 hours No results found for this basename: AMMONIA,  in the last 168 hours CBC:  Recent Labs Lab 10/28/13 1716 10/29/13 0125 10/30/13 0355  WBC 8.9 7.8 6.8  NEUTROABS 5.8  --   --   HGB 11.5* 10.5* 10.6*  HCT 34.0* 31.3* 32.8*  MCV 79.8 80.1 81.8  PLT 269 270 314   Cardiac Enzymes: No results found for this basename: CKTOTAL, CKMB, CKMBINDEX, TROPONINI,  in the last 168 hours BNP (last 3 results) No  results found for this basename: PROBNP,  in the last 8760 hours CBG:  Recent Labs Lab 10/29/13 1120 10/29/13 1800 10/29/13 2342 10/30/13 0738 10/30/13 1158  GLUCAP 151* 135* 171* 143* 118*    Recent Results (from the past 240 hour(s))  URINE CULTURE     Status: None   Collection Time    10/29/13 12:21 AM      Result Value Ref Range Status   Specimen Description URINE, RANDOM   Final   Special Requests NONE   Final   Culture  Setup Time     Final   Value: 10/29/2013 04:29     Performed at [facility]     Final   Value: NO GROWTH     Performed at [facility]   Culture     Final   Value: NO GROWTH     Performed at [facility]   Report Status 10/30/2013 FINAL   Final  MRSA PCR SCREENING     Status: None   Collection Time    10/29/13 12:27 AM      Result Value Ref Range Status   MRSA by PCR NEGATIVE  NEGATIVE Final   Comment:            The GeneXpert MRSA Assay (FDA     approved for NASAL specimens     only), is one component of a     comprehensive MRSA colonization     surveillance program. It is not     intended to diagnose MRSA     infection nor to guide or     monitor treatment for     MRSA infections.     Studies: Dg Chest 2 View  10/29/2013   CLINICAL DATA:  Chest pain.  History of hypertension.  EXAM: CHEST  2 VIEW  COMPARISON:  No priors.  FINDINGS: Lung volumes are normal. No consolidative airspace disease. No pleural effusions. No pneumothorax. No pulmonary nodule or mass noted. Pulmonary vasculature and the cardiomediastinal silhouette are within normal limits.  IMPRESSION: 1.  No radiographic evidence of acute cardiopulmonary disease.   Electronically Signed   By: Vinnie Langton M.D.   On: 10/29/2013 00:00   Dg Cervical Spine Complete  10/28/2013   CLINICAL DATA:  Neck pain and stiffness  EXAM: CERVICAL SPINE  4+ VIEWS  COMPARISON:  None.  FINDINGS: There is no evidence of cervical spine fracture. The prevertebral  soft tissues are thickened. There is loss of normal lordosis of cervical spine spine with straightening of cervical spine. No other significant bone abnormalities are identified.  IMPRESSION: Thickening of prevertebral soft tissues. Further evaluation with a cervical spine CT is recommended. Loss of normal lordosis of cervical spine with straightening of cervical spine which could be positional or due to muscle spasm.  Electronically Signed   By: Abelardo Diesel M.D.   On: 10/28/2013 16:03   Ct Head Wo Contrast  10/28/2013   CLINICAL DATA:  Expressive aphasia  EXAM: CT HEAD WITHOUT CONTRAST  TECHNIQUE: Contiguous axial images were obtained from the base of the skull through the vertex without intravenous contrast.  COMPARISON:  None.  FINDINGS: The ventricles are normal in size and configuration. No extra-axial fluid collections are identified. The gray-white differentiation is normal. No CT findings for acute intracranial process such as hemorrhage or infarction. No mass lesions. The brainstem and cerebellum are grossly normal.  The bony structures are intact. The paranasal sinuses and mastoid air cells are clear. The globes are intact.  IMPRESSION: No acute intracranial findings or mass lesion.   Electronically Signed   By: Kalman Jewels M.D.   On: 10/28/2013 17:13   Ct Soft Tissue Neck W Contrast  10/28/2013   CLINICAL DATA:  Severe pain.  Neck mass.  EXAM: CT NECK WITH CONTRAST  TECHNIQUE: Multidetector CT imaging of the neck was performed using the standard protocol following the bolus administration of intravenous contrast.  CONTRAST:  140m OMNIPAQUE IOHEXOL 300 MG/ML  SOLN  COMPARISON:  CT HEAD W/O CM dated 10/28/2013; DG CERVICAL SPINE COMPLETE dated 10/28/2013  FINDINGS: Mucous retention cyst left maxillary sinus. Nasopharynx is clear. Visualized portion of the tongue are unremarkable. Oropharynx and larynx are unremarkable. Trachea is widely patent. Pulmonary apices are clear. Salivary glands are  unremarkable. Diffuse submandibular and diffuse cervical lymph nodes are present. These are innumerable. Lymph node measuring up to 11 mm in transverse diameter noted. A large ill-defined left lower neck/ left supraclavicular mass with associated severe edema noted. This occupies the almost the entire left lower neck and supraclavicular space and measures in AP diameter approximately 8 cm. This mass is associated with prominent subcutaneous edema. There is associated prevertebral soft tissue thickening and possible involvement. The adjacent subclavian artery and vein appear to be patent. Left internal jugular vein and left common carotid artery are patent. Given the absence of a history of direct trauma or infectious symptoms, this ill-defined mass is concerning for a malignancy. No acute cervical spine bony abnormalities identified. Straightening of cervical spine is most likely related to adjacent soft tissue process and/or positioning. Evaluation by Oncology suggested. PET-CT can be obtained for further evaluation. Mammography should also be considered. These results were called by telephone at the time of interpretation on 10/28/2013 at 6:54 PM to Dr. KAlvina Chou, who verbally acknowledged these results.  IMPRESSION: Large ill-defined left supraclavicular and left lower neck mass with associated extensive cervical lymph nodes and edema. Given the patient's history this is concerning for a malignancy. Evaluation by Oncology suggested. PET-CT can be obtained for further evaluation. Mammography should also be considered.   Electronically Signed   By: TMarcello Moores Register   On: 10/28/2013 18:56   Ct Chest W Contrast  10/29/2013   CLINICAL DATA:  Left neck mass with swelling and shoulder pain.  EXAM: CT CHEST, ABDOMEN, AND PELVIS WITH CONTRAST  TECHNIQUE: Multidetector CT imaging of the chest, abdomen and pelvis was performed following the standard protocol during bolus administration of intravenous contrast.   CONTRAST:  1023mOMNIPAQUE IOHEXOL 300 MG/ML  SOLN  COMPARISON:  CT NECK W/CM dated 10/28/2013; CT HEAD W/O CM dated 10/28/2013  FINDINGS: CT CHEST FINDINGS  There is ill-defined and somewhat infiltrative appearing intermediate density in the left supraclavicular fossa, incompletely imaged. There are adjacent lymph nodes, better delineated  on yesterday's exam. Stranding extends into the left superior mediastinum. No pathologically enlarged mediastinal, hilar or axillary lymph nodes. Mediastinal lymph nodes are subcentimeter in short axis size. No hilar or axillary adenopathy. Specifically, left axillary and subpectoral lymph nodes measure up to 10 mm in short axis. Heart size normal. No pericardial effusion.  Trace left pleural fluid. Minimal subpleural atelectasis in the left lower lobe. Lungs are otherwise clear. Airway is unremarkable.  CT ABDOMEN AND PELVIS FINDINGS  Liver is unremarkable. Intermediate attenuation material seen in the gallbladder may represent sludge. Adrenal glands, kidneys, spleen, pancreas, stomach and bowel are unremarkable. Peripherally calcified lesion in the uterus measures 4.8 cm and is indicative of a fibroid. Ovaries are visualized. No pathologically enlarged lymph nodes. No free fluid. No worrisome lytic or sclerotic lesions.  IMPRESSION: 1. Ill-defined and somewhat infiltrative appearing intermediate density attenuation in the left supraclavicular fossa, incompletely imaged. Please refer to neck CT done yesterday for further details and discussion. Inflammatory changes appear to extend to the superior aspect of the left mediastinum. 2. No evidence of primary malignancy or metastatic disease in the chest, abdomen or pelvis. 3. Trace left pleural fluid.   Electronically Signed   By: Lorin Picket M.D.   On: 10/29/2013 13:45   Mr Jeri Cos ZO Contrast  10/29/2013   CLINICAL DATA:  Abnormal speech. Supraclavicular mass. Possible CVA. Evaluate for intracranial lesion.  EXAM: MRI HEAD  WITHOUT AND WITH CONTRAST  TECHNIQUE: Multiplanar, multiecho pulse sequences of the brain and surrounding structures were obtained without and with intravenous contrast.  CONTRAST:  29m MULTIHANCE GADOBENATE DIMEGLUMINE 529 MG/ML IV SOLN  COMPARISON:  CT NECK W/CM dated 10/28/2013; CT HEAD W/O CM dated 10/28/2013  FINDINGS: No evidence for acute infarction, hemorrhage, mass lesion, hydrocephalus, or extra-axial fluid. Slight premature cerebral and cerebellar atrophy. Mild subcortical and periventricular T2 and FLAIR hyperintensities, likely chronic microvascular ischemic change. Flow voids are maintained throughout the carotid, basilar, and vertebral arteries. There are no areas of chronic hemorrhage.  Sagittal images reveal mild prominence of the retropharyngeal soft tissues from C3 and above. Mild nasopharyngeal adenoidal hypertrophy. No osseous abnormalities.  Post infusion, no abnormal enhancement of the brain or meninges. Slight prominence of the epidural veins upper cervical region. Widespread cervical adenopathy. No acute sinus or mastoid disease.  There is asymmetric prominence of left lacrimal gland which appears enlarged. No intraconal mass.  IMPRESSION: Mild atrophy and small vessel disease. No acute intracranial abnormalities.  Mild prominence of the retropharyngeal soft tissues. I doubt this is an abscess, but relates to the supraclavicular mass.  Asymmetric prominence of the left lacrimal gland which appears enlarged. In the setting of widespread lymphadenopathy, and a large supraclavicular mass (possible extracapsular spread?), the findings may represent non-Hodgkin's lymphoma. Tissue sampling of an accessible lymph node may be warranted, along with additional CT chest, abdomen, and pelvis imaging.   Electronically Signed   By: JRolla FlattenM.D.   On: 10/29/2013 08:37   Ct Abdomen Pelvis W Contrast  10/29/2013   CLINICAL DATA:  Left neck mass with swelling and shoulder pain.  EXAM: CT CHEST,  ABDOMEN, AND PELVIS WITH CONTRAST  TECHNIQUE: Multidetector CT imaging of the chest, abdomen and pelvis was performed following the standard protocol during bolus administration of intravenous contrast.  CONTRAST:  102mOMNIPAQUE IOHEXOL 300 MG/ML  SOLN  COMPARISON:  CT NECK W/CM dated 10/28/2013; CT HEAD W/O CM dated 10/28/2013  FINDINGS: CT CHEST FINDINGS  There is ill-defined and somewhat infiltrative appearing intermediate  density in the left supraclavicular fossa, incompletely imaged. There are adjacent lymph nodes, better delineated on yesterday's exam. Stranding extends into the left superior mediastinum. No pathologically enlarged mediastinal, hilar or axillary lymph nodes. Mediastinal lymph nodes are subcentimeter in short axis size. No hilar or axillary adenopathy. Specifically, left axillary and subpectoral lymph nodes measure up to 10 mm in short axis. Heart size normal. No pericardial effusion.  Trace left pleural fluid. Minimal subpleural atelectasis in the left lower lobe. Lungs are otherwise clear. Airway is unremarkable.  CT ABDOMEN AND PELVIS FINDINGS  Liver is unremarkable. Intermediate attenuation material seen in the gallbladder may represent sludge. Adrenal glands, kidneys, spleen, pancreas, stomach and bowel are unremarkable. Peripherally calcified lesion in the uterus measures 4.8 cm and is indicative of a fibroid. Ovaries are visualized. No pathologically enlarged lymph nodes. No free fluid. No worrisome lytic or sclerotic lesions.  IMPRESSION: 1. Ill-defined and somewhat infiltrative appearing intermediate density attenuation in the left supraclavicular fossa, incompletely imaged. Please refer to neck CT done yesterday for further details and discussion. Inflammatory changes appear to extend to the superior aspect of the left mediastinum. 2. No evidence of primary malignancy or metastatic disease in the chest, abdomen or pelvis. 3. Trace left pleural fluid.   Electronically Signed   By:  Lorin Picket M.D.   On: 10/29/2013 13:45   US Biopsy  10/29/2013   CLINICAL DATA:  59 year old female with relatively recent development of and abnormal, ill-defined infiltrating process in the left supraclavicular fossa which is been a minimally responsive to IV antibiotic therapy.  EXAM: ULTRASOUND BIOPSY CORE LIVER  Date: 10/29/2013  PROCEDURE: 1. Ultrasound core biopsy Interventional Radiologist:  Criselda Peaches, MD  ANESTHESIA/SEDATION: None  TECHNIQUE: Informed consent was obtained from the patient following explanation of the procedure, risks, benefits and alternatives. The patient understands, agrees and consents for the procedure. All questions were addressed. A time out was performed.  The left supraclavicular fossa was interrogated with ultrasound. There is an irregular area of mixed echogenicity which does not appear particularly masslike but appears something more like edema in the subcutaneous fat. There are several small lymph nodes which are unremarkable in the region. No discrete mass is identified. However, the region is palpable is a masslike abnormality and the adjacent tissues are unremarkable.  Therefore, the decision was made to proceed with biopsy. A suitable skin entry site was selected and marked. The region was prepped and draped in the standard sterile fashion using Betadine skin prep. Local anesthesia was attained by infiltration with 1% lidocaine. Under real-time sonographic guidance, multiple 18 gauge core biopsies were obtained using the bio Pince automated biopsy device. Care was taken to avoid the adjacent vascular structures. There is no evidence of a bleeding or post procedure complication.  Specimens were delivered to pathology.  IMPRESSION: 1. Ultrasound interrogation of the left supraclavicular fossa corresponds with the recent CT findings. There is a very ill-defined abnormality in the left supraclavicular fossa which does not appear particularly masslike  sonographically. 2. Several core biopsies were obtained in the region of the abnormality. At least 1 biopsy sampled a very small adjacent lymph node as well. Signed,  Criselda Peaches, MD  [facility]  [facility]   Electronically Signed   By: Jacqulynn Cadet M.D.   On: 10/29/2013 17:09    Scheduled Meds: . amLODipine  10 mg Oral Daily  . dexamethasone  4 mg Intravenous 4 times per day  . insulin aspart  0-20 Units Subcutaneous TID WC  . pantoprazole  40 mg Oral Q0600  . sodium chloride  3 mL Intravenous Q12H  . sodium chloride  3 mL Intravenous Q12H   Continuous Infusions:       Time spent: 25 minutes    Tzion Wedel, [place]  [facility] Pager [phone] If 7PM-7AM, please contact night-coverage at [email or web address], password [facility] 10/30/2013, 2:05 PM  LOS: 2 days

## 2013-10-30 NOTE — Evaluation (Signed)
Clinical/Bedside Swallow Evaluation Patient Details  Name: Amber Newman MRN: [id number] Date of Birth: 03/06/1956  Today's Date: 10/30/2013 Time: 1115-1130 SLP Time Calculation (min): 15 min  Past Medical History:  Past Medical History  Diagnosis Date  . Hypertension   . Diabetes mellitus without complication   . HSV infection   . HTN (hypertension) 10/28/2013  . Diabetes mellitus type 2, diet-controlled 10/28/2013   Past Surgical History:  Past Surgical History  Procedure Laterality Date  . Tonsillectomy    . Eye surgery     HPI:  One-week history of large left supraclavicular mass. History of fever and unexplained chills recently. History of significant fatigue. Family history of lymphoma.  CT of the neck showed a large ill-defined left supraclavicular and left lower neck mass with associated extensive cervical lymph nodes and edema.   Patient referred for BSE due to increased difficulty swallowing solids.  Patient describes dysphagia as feeling of food and liquid "sticking" and that she had to tilt her head forward and than back to get food and liquid to "go down".    Assessment / Plan / Recommendation Clinical Impression  BSE completed.  Indicates suspected reversable oropharyngeal dysphagia secondary to probable edema.  Patient tolerated all consistencies w/o reports of pain or indication of aspiration.  Indication of containment with larger cup sips of water per palpation.  No reports of globus sensation with soft solids.  Diagnostic treatment completed following evaluation focusing on diet recommendations and recommended swallow strategies.    Patient tolerating current clear liquid diet.  Recommend to upgrade to dysphagia 3 as tolerated.  No further Skilled ST indicated in acute care.   [facility] symptoms persist.  ST to sign off as education complete.      Aspiration Risk  Mild    Diet Recommendation Dysphagia 3 (Mechanical Soft)   Liquid  Administration via: Cup Medication Administration: Whole meds with puree Supervision: Patient able to self feed Compensations: Slow rate;Small sips/bites;Multiple dry swallows after each bite/sip Postural Changes and/or Swallow Maneuvers: Out of bed for meals;Seated upright 90 degrees;Upright 30-60 min after meal    Other  Recommendations Oral Care Recommendations: Oral care BID Other Recommendations: Clarify dietary restrictions   Follow Up Recommendations  None         Swallow Study Prior Functional Status   No prior history of dysphagia     General Date of Onset: 10/28/13 HPI: One-week history of large left supraclavicular mass. History of fever and unexplained chills recently. History of significant fatigue. Family history of lymphoma. Admitted last night, CT reveals large lower neck mass. Type of Study: Bedside swallow evaluation Diet Prior to this Study: Thin liquids Temperature Spikes Noted: No Respiratory Status: Room air History of Recent Intubation: No Behavior/Cognition: Alert;Cooperative Oral Cavity - Dentition: Adequate natural dentition Self-Feeding Abilities: Able to feed self Patient Positioning: Upright in bed Baseline Vocal Quality: Clear Volitional Cough: Strong Volitional Swallow: Able to elicit    Oral/Motor/Sensory Function     Ice Chips Ice chips: Within functional limits   Thin Liquid Thin Liquid: Within functional limits Presentation: Cup    Nectar Thick Nectar Thick Liquid: Not tested   Honey Thick Honey Thick Liquid: Not tested   Puree Puree: Within functional limits   Solid   GO    Solid: Within functional limits      Moreen FowlerKaren Ylianna Almanzar MS, CCC-SLP [phone] [facility]ANK[facility],Yehudit Fulginiti 10/30/2013,12:35 PM

## 2013-10-31 DIAGNOSIS — D509 Iron deficiency anemia, unspecified: Secondary | ICD-10-CM

## 2013-10-31 LAB — GLUCOSE, CAPILLARY
GLUCOSE-CAPILLARY: 193 mg/dL — AB (ref 70–99)
GLUCOSE-CAPILLARY: 174 mg/dL — AB (ref 70–99)
Glucose-Capillary: 161 mg/dL — ABNORMAL HIGH (ref 70–99)

## 2013-10-31 LAB — LACTATE DEHYDROGENASE: LDH: 177 U/L (ref 94–250)

## 2013-10-31 LAB — EPSTEIN BARR VRS(EBV DNA BY PCR): EBV DNA QN by PCR: <200 copies/mL

## 2013-10-31 MED ORDER — PANTOPRAZOLE SODIUM 40 MG PO TBEC: 40.0000 mg | DELAYED_RELEASE_TABLET | Freq: Every day | ORAL | Status: DC

## 2013-10-31 MED ORDER — FERROUS GLUCONATE 324 (38 FE) MG PO TABS: 324.0000 mg | ORAL_TABLET | Freq: Two times a day (BID) | ORAL | Status: DC

## 2013-10-31 MED ORDER — DEXAMETHASONE 4 MG PO TABS: 4.0000 mg | ORAL_TABLET | Freq: Two times a day (BID) | ORAL | Status: DC

## 2013-10-31 MED ORDER — OXYCODONE-ACETAMINOPHEN 5-325 MG PO TABS: 1.0000 | ORAL_TABLET | Freq: Four times a day (QID) | ORAL | Status: DC | PRN

## 2013-10-31 NOTE — Discharge Summary (Addendum)
Physician Discharge Summary  Amber Newman MWU:[id number] DOB: 10-06-1955 DOA: 10/28/2013  PCP:  Amber Crutch, MD  Admit date: 10/28/2013 Discharge date: 10/31/2013  Time spent: 40  minutes  Recommendations for Outpatient Follow-up:  1. Home with outpt follow up with oncology Dr Marin [name] within 1 week depending upon result of FNA of left supraclavicular mass done on 3/26. teh biopsy result should be available by late 3/31. 2. Follow up with Dr [name] ENT as outpt in 1 week for excisional bx of the mass if FNA inconclusive or unsatisfactory 3.  follow up with PCP  ( has appt in 2-3 weeks). Recommend outpt GI referral for coloscopy.  Discharge Diagnoses:  Principal Problem:  Left  Supraclavicular mass  Active Problems:   HTN (hypertension)   Diabetes mellitus type 2, diet-controlled   Dysphagia   Speech abnormality   Anemia   Anemia, iron deficiency   obesity   Discharge Condition: fair  Diet recommendation: diabetic  Filed Weights   10/29/13 0600 10/30/13 0615 10/31/13 0542  Weight: 111.7 kg (246 lb 4.1 oz) 110.9 kg (244 lb 7.8 oz) 111.585 kg (246 lb)    History of present illness:  Please refer to admission aH&P for details, but in brief, 58 y.o. female With history of HTN, diet-controlled diabetes, family history of Hodgkin's lymphoma who presents to the ED with 7-8 day history of left neck swelling and pain as well as left shoulder pain. Patient found to have large left supraclavicular mass.    Hospital Course:  left supraclavicular mass  concerning for lymphoma. Patient noted to have fever with some chills with associated left supraclavicular mass with pain as well as some dysphagia, speech abnormalities, gait abnormalities and a sensation of gagging when she is laying down.  MRI brain shows no cerebral lesion.  Concern for extracapsular spread with enlarged left lacrimal gland.  -LFTs normal. LDH normal, hepatitis panel negative. ESR elevated. HIV ab negative. CT of the  chest, abdomen and pelvis unremarkable. -FNA done by IR on 3/27. ENT consult appreciated. Will need excision bx if FNA inconclusive an can be dosne as outpt.  -oncology consult Appreciated.  patient's symptoms have much improved now. She is able to move her neck side to side without much difficulty. Her speech has much improved and is related to extensive cervicale edema pressing on her vocal cord. -she does not need to stay inpatient as further workup and treatment can be done by her oncologist and ENT based upon FNA result which should be available by early this week.  - i will discharge her on prn percocet for pain and decadron taper for cervical edema . Added protonix for GI prophylaxis. Patient instructed to follow up in ED if she has any focal weakness, worsened neck pain or swelling, speech or swallowing impairment, hoarseness of voice or shortness of breath.     dysphagia  Likely secondary to above with edema noted on CT . Seen by swallow eval and placed on dysphagia diet which can be advanced depending upon her progression. Dysphagia should resolve once the edema further improves or subsides.  Discharge on oral decadron taper.  speech abnormalities/gait ataxia  MRI unremarkable. Speech impairment secondary to cervical edema and improving. ( patient reports she was able to sing without difficulty this morning) . Patient seen by PT. Recommended HHPT initially, however she has been able to ambulate without difficulty.    Anemia  iron panel suggests iron deficiency. ? Related to malignancy. stool for occult blood negative.  Will add iron supplement upon discharge. Will need GI referral  for screening colonoscopy as outpt.  hypertension  Stable. Continue home BP meds  diet-controlled type 2 diabetes  . A1C of 7.6.FSG stable.patient would like to try diet and lifestyle modifications and stay away from medications if possible.    Severe malnutrition Seen by nutrition consult and based on  assessment has severe protein calorie malnutrition. Recommended adding resource breeze bid. Handouts given with RD contact information.    Code Status: full code   Family Communication: none at bedside  Disposition Plan: Home with outpt follow up   Consultants:  ENT ( Dr [name]) IR  Oncology ( Dr Marin [name])   Procedures:  IR guided bx on 3/7   Antibiotics:  none  Discharge Exam: Filed Vitals:   10/31/13 0542  BP: 121/62  Pulse: 73  Temp: 97.9 F (36.6 C)  Resp: 16    General: Middle aged obese female in NAD.  HEENT: no pallor, moist oral mucosa, soft to firm mass over left neck with ill defined margin. Non tender, no cervical , supraclavicular or axillary LN  Cardiovascular: NS1&S2, no murmurs, rubs or gallop  Respiratory: clear b/l, no added sounds  Abdomen: soft, NT ND, BS+  Musculoskeletal: soft, NT, ND, BS+  Ext: warm, no edema  CNS: AAOX3, no weakness, mild stuttering of speech which has improved markedly   Discharge Instructions     Medication List    STOP taking these medications       clindamycin 300 MG capsule  Commonly known as:  CLEOCIN     cyclobenzaprine 5 MG tablet  Commonly known as:  FLEXERIL     pseudoephedrine 30 MG tablet  Commonly known as:  SUDAFED      TAKE these medications       albuterol 108 (90 BASE) MCG/ACT inhaler  Commonly known as:  PROVENTIL HFA;VENTOLIN HFA  Inhale 2 puffs into the lungs every 4 (four) hours as needed for shortness of breath (cough).     amLODipine 10 MG tablet  Commonly known as:  NORVASC  Take 10 mg by mouth daily.     dexamethasone 4 MG tablet  Commonly known as:  DECADRON  Take 1 tablet (4 mg total) by mouth 2 (two) times daily with a meal. Take 4 mg BID for 3 days, then 2 mg TID for next 3 days, then 2 mg bid for 3 days, then 2 mg daily for next 3 days then stop     ferrous gluconate 324 MG tablet  Commonly known as:  FERGON  Take 1 tablet (324 mg total) by mouth 2 (two) times daily with a  meal.     oxyCODONE-acetaminophen 5-325 MG per tablet  Commonly known as:  ROXICET  Take 1 tablet by mouth every 6 (six) hours as needed for severe pain.     pantoprazole 40 MG tablet  Commonly known as:  PROTONIX  Take 1 tablet (40 mg total) by mouth daily. Switch for any other PPI at similar dose and frequency       Allergies  Allergen Reactions  . Codeine Rash  . Latex Rash  . Penicillins Rash  . Sulfa Antibiotics Rash       Follow-up Information   Follow up with  Amber Crutch, MD In 1 week.   Specialty:  Family Medicine   Contact information:   [address] [place] [address] [phone]       Follow up with ENNEVER,PETER R,  MD In 1 week.   Specialty:  Oncology   Contact information:   [address], [address] [place] [address] [phone]       Follow up with Izora Gala, MD. (As needed. if FNA inconclusive and needs excisional biopsy.)    Specialty:  Otolaryngology   Contact information:   [address] [place] [address] [phone]        The results of significant diagnostics from this hospitalization (including imaging, microbiology, ancillary and laboratory) are listed below for reference.    Significant Diagnostic Studies: Dg Chest 2 View  10/29/2013   CLINICAL DATA:  Chest pain.  History of hypertension.  EXAM: CHEST  2 VIEW  COMPARISON:  No priors.  FINDINGS: Lung volumes are normal. No consolidative airspace disease. No pleural effusions. No pneumothorax. No pulmonary nodule or mass noted. Pulmonary vasculature and the cardiomediastinal silhouette are within normal limits.  IMPRESSION: 1.  No radiographic evidence of acute cardiopulmonary disease.   Electronically Signed   By: Vinnie Langton M.D.   On: 10/29/2013 00:00   Dg Cervical Spine Complete  10/28/2013   CLINICAL DATA:  Neck pain and stiffness  EXAM: CERVICAL SPINE  4+ VIEWS  COMPARISON:  None.  FINDINGS: There is no evidence of cervical spine  fracture. The prevertebral soft tissues are thickened. There is loss of normal lordosis of cervical spine spine with straightening of cervical spine. No other significant bone abnormalities are identified.  IMPRESSION: Thickening of prevertebral soft tissues. Further evaluation with a cervical spine CT is recommended. Loss of normal lordosis of cervical spine with straightening of cervical spine which could be positional or due to muscle spasm.   Electronically Signed   By: Abelardo Diesel M.D.   On: 10/28/2013 16:03   Ct Head Wo Contrast  10/28/2013   CLINICAL DATA:  Expressive aphasia  EXAM: CT HEAD WITHOUT CONTRAST  TECHNIQUE: Contiguous axial images were obtained from the base of the skull through the vertex without intravenous contrast.  COMPARISON:  None.  FINDINGS: The ventricles are normal in size and configuration. No extra-axial fluid collections are identified. The gray-white differentiation is normal. No CT findings for acute intracranial process such as hemorrhage or infarction. No mass lesions. The brainstem and cerebellum are grossly normal.  The bony structures are intact. The paranasal sinuses and mastoid air cells are clear. The globes are intact.  IMPRESSION: No acute intracranial findings or mass lesion.   Electronically Signed   By: Kalman Jewels M.D.   On: 10/28/2013 17:13   Ct Soft Tissue Neck W Contrast  10/28/2013   CLINICAL DATA:  Severe pain.  Neck mass.  EXAM: CT NECK WITH CONTRAST  TECHNIQUE: Multidetector CT imaging of the neck was performed using the standard protocol following the bolus administration of intravenous contrast.  CONTRAST:  168m OMNIPAQUE IOHEXOL 300 MG/ML  SOLN  COMPARISON:  CT HEAD W/O CM dated 10/28/2013; DG CERVICAL SPINE COMPLETE dated 10/28/2013  FINDINGS: Mucous retention cyst left maxillary sinus. Nasopharynx is clear. Visualized portion of the tongue are unremarkable. Oropharynx and larynx are unremarkable. Trachea is widely patent. Pulmonary apices are  clear. Salivary glands are unremarkable. Diffuse submandibular and diffuse cervical lymph nodes are present. These are innumerable. Lymph node measuring up to 11 mm in transverse diameter noted. A large ill-defined left lower neck/ left supraclavicular mass with associated severe edema noted. This occupies the almost the entire left lower neck and supraclavicular space and measures in AP diameter approximately  8 cm. This mass is associated with prominent subcutaneous edema. There is associated prevertebral soft tissue thickening and possible involvement. The adjacent subclavian artery and vein appear to be patent. Left internal jugular vein and left common carotid artery are patent. Given the absence of a history of direct trauma or infectious symptoms, this ill-defined mass is concerning for a malignancy. No acute cervical spine bony abnormalities identified. Straightening of cervical spine is most likely related to adjacent soft tissue process and/or positioning. Evaluation by Oncology suggested. PET-CT can be obtained for further evaluation. Mammography should also be considered. These results were called by telephone at the time of interpretation on 10/28/2013 at 6:54 PM to Dr. Alvina Chou , who verbally acknowledged these results.  IMPRESSION: Large ill-defined left supraclavicular and left lower neck mass with associated extensive cervical lymph nodes and edema. Given the patient's history this is concerning for a malignancy. Evaluation by Oncology suggested. PET-CT can be obtained for further evaluation. Mammography should also be considered.   Electronically Signed   By: Marcello Moores  Register   On: 10/28/2013 18:56   Ct Chest W Contrast  10/29/2013   CLINICAL DATA:  Left neck mass with swelling and shoulder pain.  EXAM: CT CHEST, ABDOMEN, AND PELVIS WITH CONTRAST  TECHNIQUE: Multidetector CT imaging of the chest, abdomen and pelvis was performed following the standard protocol during bolus administration  of intravenous contrast.  CONTRAST:  173m OMNIPAQUE IOHEXOL 300 MG/ML  SOLN  COMPARISON:  CT NECK W/CM dated 10/28/2013; CT HEAD W/O CM dated 10/28/2013  FINDINGS: CT CHEST FINDINGS  There is ill-defined and somewhat infiltrative appearing intermediate density in the left supraclavicular fossa, incompletely imaged. There are adjacent lymph nodes, better delineated on yesterday's exam. Stranding extends into the left superior mediastinum. No pathologically enlarged mediastinal, hilar or axillary lymph nodes. Mediastinal lymph nodes are subcentimeter in short axis size. No hilar or axillary adenopathy. Specifically, left axillary and subpectoral lymph nodes measure up to 10 mm in short axis. Heart size normal. No pericardial effusion.  Trace left pleural fluid. Minimal subpleural atelectasis in the left lower lobe. Lungs are otherwise clear. Airway is unremarkable.  CT ABDOMEN AND PELVIS FINDINGS  Liver is unremarkable. Intermediate attenuation material seen in the gallbladder may represent sludge. Adrenal glands, kidneys, spleen, pancreas, stomach and bowel are unremarkable. Peripherally calcified lesion in the uterus measures 4.8 cm and is indicative of a fibroid. Ovaries are visualized. No pathologically enlarged lymph nodes. No free fluid. No worrisome lytic or sclerotic lesions.  IMPRESSION: 1. Ill-defined and somewhat infiltrative appearing intermediate density attenuation in the left supraclavicular fossa, incompletely imaged. Please refer to neck CT done yesterday for further details and discussion. Inflammatory changes appear to extend to the superior aspect of the left mediastinum. 2. No evidence of primary malignancy or metastatic disease in the chest, abdomen or pelvis. 3. Trace left pleural fluid.   Electronically Signed   By: MLorin PicketM.D.   On: 10/29/2013 13:45   Mr BJeri CosWOMContrast  10/29/2013   CLINICAL DATA:  Abnormal speech. Supraclavicular mass. Possible CVA. Evaluate for intracranial  lesion.  EXAM: MRI HEAD WITHOUT AND WITH CONTRAST  TECHNIQUE: Multiplanar, multiecho pulse sequences of the brain and surrounding structures were obtained without and with intravenous contrast.  CONTRAST:  278mMULTIHANCE GADOBENATE DIMEGLUMINE 529 MG/ML IV SOLN  COMPARISON:  CT NECK W/CM dated 10/28/2013; CT HEAD W/O CM dated 10/28/2013  FINDINGS: No evidence for acute infarction, hemorrhage, mass lesion, hydrocephalus, or extra-axial fluid. Slight premature  cerebral and cerebellar atrophy. Mild subcortical and periventricular T2 and FLAIR hyperintensities, likely chronic microvascular ischemic change. Flow voids are maintained throughout the carotid, basilar, and vertebral arteries. There are no areas of chronic hemorrhage.  Sagittal images reveal mild prominence of the retropharyngeal soft tissues from C3 and above. Mild nasopharyngeal adenoidal hypertrophy. No osseous abnormalities.  Post infusion, no abnormal enhancement of the brain or meninges. Slight prominence of the epidural veins upper cervical region. Widespread cervical adenopathy. No acute sinus or mastoid disease.  There is asymmetric prominence of left lacrimal gland which appears enlarged. No intraconal mass.  IMPRESSION: Mild atrophy and small vessel disease. No acute intracranial abnormalities.  Mild prominence of the retropharyngeal soft tissues. I doubt this is an abscess, but relates to the supraclavicular mass.  Asymmetric prominence of the left lacrimal gland which appears enlarged. In the setting of widespread lymphadenopathy, and a large supraclavicular mass (possible extracapsular spread?), the findings may represent non-Hodgkin's lymphoma. Tissue sampling of an accessible lymph node may be warranted, along with additional CT chest, abdomen, and pelvis imaging.   Electronically Signed   By: Rolla Flatten M.D.   On: 10/29/2013 08:37   Ct Abdomen Pelvis W Contrast  10/29/2013   CLINICAL DATA:  Left neck mass with swelling and shoulder pain.   EXAM: CT CHEST, ABDOMEN, AND PELVIS WITH CONTRAST  TECHNIQUE: Multidetector CT imaging of the chest, abdomen and pelvis was performed following the standard protocol during bolus administration of intravenous contrast.  CONTRAST:  112m OMNIPAQUE IOHEXOL 300 MG/ML  SOLN  COMPARISON:  CT NECK W/CM dated 10/28/2013; CT HEAD W/O CM dated 10/28/2013  FINDINGS: CT CHEST FINDINGS  There is ill-defined and somewhat infiltrative appearing intermediate density in the left supraclavicular fossa, incompletely imaged. There are adjacent lymph nodes, better delineated on yesterday's exam. Stranding extends into the left superior mediastinum. No pathologically enlarged mediastinal, hilar or axillary lymph nodes. Mediastinal lymph nodes are subcentimeter in short axis size. No hilar or axillary adenopathy. Specifically, left axillary and subpectoral lymph nodes measure up to 10 mm in short axis. Heart size normal. No pericardial effusion.  Trace left pleural fluid. Minimal subpleural atelectasis in the left lower lobe. Lungs are otherwise clear. Airway is unremarkable.  CT ABDOMEN AND PELVIS FINDINGS  Liver is unremarkable. Intermediate attenuation material seen in the gallbladder may represent sludge. Adrenal glands, kidneys, spleen, pancreas, stomach and bowel are unremarkable. Peripherally calcified lesion in the uterus measures 4.8 cm and is indicative of a fibroid. Ovaries are visualized. No pathologically enlarged lymph nodes. No free fluid. No worrisome lytic or sclerotic lesions.  IMPRESSION: 1. Ill-defined and somewhat infiltrative appearing intermediate density attenuation in the left supraclavicular fossa, incompletely imaged. Please refer to neck CT done yesterday for further details and discussion. Inflammatory changes appear to extend to the superior aspect of the left mediastinum. 2. No evidence of primary malignancy or metastatic disease in the chest, abdomen or pelvis. 3. Trace left pleural fluid.   Electronically  Signed   By: MLorin PicketM.D.   On: 10/29/2013 13:45   [place]oreaBiopsy  10/29/2013   CLINICAL DATA:  58year old female with relatively recent development of and abnormal, ill-defined infiltrating process in the left supraclavicular fossa which is been a minimally responsive to IV antibiotic therapy.  EXAM: ULTRASOUND BIOPSY CORE LIVER  Date: 10/29/2013  PROCEDURE: 1. Ultrasound core biopsy Interventional Radiologist:  HCriselda Peaches MD  ANESTHESIA/SEDATION: None  TECHNIQUE: Informed consent was obtained from the patient following explanation of the procedure,  risks, benefits and alternatives. The patient understands, agrees and consents for the procedure. All questions were addressed. A time out was performed.  The left supraclavicular fossa was interrogated with ultrasound. There is an irregular area of mixed echogenicity which does not appear particularly masslike but appears something more like edema in the subcutaneous fat. There are several small lymph nodes which are unremarkable in the region. No discrete mass is identified. However, the region is palpable is a masslike abnormality and the adjacent tissues are unremarkable.  Therefore, the decision was made to proceed with biopsy. A suitable skin entry site was selected and marked. The region was prepped and draped in the standard sterile fashion using Betadine skin prep. Local anesthesia was attained by infiltration with 1% lidocaine. Under real-time sonographic guidance, multiple 18 gauge core biopsies were obtained using the bio Pince automated biopsy device. Care was taken to avoid the adjacent vascular structures. There is no evidence of a bleeding or post procedure complication.  Specimens were delivered to pathology.  IMPRESSION: 1. Ultrasound interrogation of the left supraclavicular fossa corresponds with the recent CT findings. There is a very ill-defined abnormality in the left supraclavicular fossa which does not appear particularly  masslike sonographically. 2. Several core biopsies were obtained in the region of the abnormality. At least 1 biopsy sampled a very small adjacent lymph node as well. Signed,  Criselda Peaches, MD  [facility]  [facility]   Electronically Signed   By: Jacqulynn Cadet M.D.   On: 10/29/2013 17:09    Microbiology: Recent Results (from the past 240 hour(s))  URINE CULTURE     Status: None   Collection Time    10/29/13 12:21 AM      Result Value Ref Range Status   Specimen Description URINE, RANDOM   Final   Special Requests NONE   Final   Culture  Setup Time     Final   Value: 10/29/2013 04:29     Performed at [place]     Final   Value: NO GROWTH     Performed at [facility]   Culture     Final   Value: NO GROWTH     Performed at [facility]   Report Status 10/30/2013 FINAL   Final  MRSA PCR SCREENING     Status: None   Collection Time    10/29/13 12:27 AM      Result Value Ref Range Status   MRSA by PCR NEGATIVE  NEGATIVE Final   Comment:            The GeneXpert MRSA Assay (FDA     approved for NASAL specimens     only), is one component of a     comprehensive MRSA colonization     surveillance program. It is not     intended to diagnose MRSA     infection nor to guide or     monitor treatment for     MRSA infections.     Labs: Basic Metabolic Panel:  Recent Labs Lab 10/28/13 1716 10/29/13 0125  NA 135* 136*  K 4.0 4.0  CL 101 101  CO2 25 24  GLUCOSE 118* 152*  BUN 7 8  CREATININE 0.82 0.82  CALCIUM 9.1 8.8  MG  --  1.9   Liver Function Tests:  Recent Labs Lab 10/29/13 0125  AST 19  18  ALT 24  25  ALKPHOS  99  100  BILITOT 0.5  0.5  PROT 8.6*  8.9*  ALBUMIN 2.5*  2.5*   No results found for this basename: LIPASE, AMYLASE,  in the last 168 hours No results found for this basename: AMMONIA,  in the last 168 hours CBC:  Recent Labs Lab  10/28/13 1716 10/29/13 0125 10/30/13 0355  WBC 8.9 7.8 6.8  NEUTROABS 5.8  --   --   HGB 11.5* 10.5* 10.6*  HCT 34.0* 31.3* 32.8*  MCV 79.8 80.1 81.8  PLT 269 270 314   Cardiac Enzymes: No results found for this basename: CKTOTAL, CKMB, CKMBINDEX, TROPONINI,  in the last 168 hours BNP: BNP (last 3 results) No results found for this basename: PROBNP,  in the last 8760 hours CBG:  Recent Labs Lab 10/30/13 0738 10/30/13 1158 10/30/13 1749 10/30/13 2127 10/31/13 0657  GLUCAP 143* 118* 184* 146* 161*       Signed:  Royal Vandevoort  [facility] 10/31/2013, 8:54 AM

## 2013-11-01 NOTE — ED Provider Notes (Signed)
Medical screening examination/treatment/procedure(s) were conducted as a shared visit with non-physician practitioner(s) and myself.  I personally evaluated the patient during the encounter.   EKG Interpretation None      Patient presents with left supraclavicular swelling over the last week. She states that it is painful. She reports episodes of difficulty swallowing. No evidence of stridor on exam. Large tense swelling noted over the supraclavicular region on the left. No overlying skin changes. CT scan concerning for mass of unknown etiology. Given time course of swelling and concern for airway compromise, will consult ENT and admitted to the hospital for further management and oncology evaluation.  Shon Batonourtney F Horton, MD 11/01/13 2255

## 2013-11-02 ENCOUNTER — Telehealth: Payer: Self-pay | Admitting: Hematology & Oncology

## 2013-11-02 NOTE — Telephone Encounter (Signed)
Pt called wanting appointment for hospital follow up. Per MD waiting for test results. Pt aware and will call back Friday if she hasn't heard from us.

## 2013-11-03 ENCOUNTER — Telehealth: Payer: Self-pay

## 2013-11-03 NOTE — Telephone Encounter (Signed)
Received call from pt requesting biopsy results from inpt admission.  Per Dr Myna HidalgoEnnever, biopsies revealed soft tissue abscess, no evidence of Lymphoma. Pt does not need to f/u with oncology. Follow up with pcp.  Pt verbalizes appreciation and understanding. dph

## 2014-05-20 ENCOUNTER — Other Ambulatory Visit: Payer: Self-pay

## 2014-06-06 ENCOUNTER — Other Ambulatory Visit (HOSPITAL_COMMUNITY)
Admission: RE | Admit: 2014-06-06 | Discharge: 2014-06-06 | Disposition: A | Discharge status: Home / Self Care | Payer: BC Managed Care – PPO | Source: Ambulatory Visit | Attending: Family Medicine | Admitting: Family Medicine

## 2014-06-06 ENCOUNTER — Other Ambulatory Visit: Payer: Self-pay | Admitting: Family Medicine

## 2014-06-06 DIAGNOSIS — Z01411 Encounter for gynecological examination (general) (routine) with abnormal findings: Secondary | ICD-10-CM | POA: Diagnosis not present

## 2014-06-08 LAB — CYTOLOGY - PAP

## 2015-01-30 ENCOUNTER — Other Ambulatory Visit: Payer: Self-pay

## 2015-05-23 ENCOUNTER — Other Ambulatory Visit: Payer: Self-pay

## 2015-05-23 DIAGNOSIS — Z1231 Encounter for screening mammogram for malignant neoplasm of breast: Secondary | ICD-10-CM

## 2015-05-24 ENCOUNTER — Ambulatory Visit
Admission: RE | Admit: 2015-05-24 | Discharge: 2015-05-24 | Disposition: A | Discharge status: Home / Self Care | Payer: BC Managed Care – PPO | Source: Ambulatory Visit

## 2015-05-24 DIAGNOSIS — Z1231 Encounter for screening mammogram for malignant neoplasm of breast: Secondary | ICD-10-CM

## 2016-07-02 ENCOUNTER — Other Ambulatory Visit: Payer: Self-pay | Admitting: Family Medicine

## 2016-07-02 DIAGNOSIS — Z1231 Encounter for screening mammogram for malignant neoplasm of breast: Secondary | ICD-10-CM

## 2016-07-05 ENCOUNTER — Ambulatory Visit
Admission: RE | Admit: 2016-07-05 | Discharge: 2016-07-05 | Disposition: A | Discharge status: Home / Self Care | Payer: BC Managed Care – PPO | Source: Ambulatory Visit | Attending: Family Medicine | Admitting: Family Medicine

## 2016-07-05 DIAGNOSIS — Z1231 Encounter for screening mammogram for malignant neoplasm of breast: Secondary | ICD-10-CM

## 2017-07-04 ENCOUNTER — Other Ambulatory Visit: Payer: Self-pay | Admitting: Family Medicine

## 2017-07-04 DIAGNOSIS — Z1231 Encounter for screening mammogram for malignant neoplasm of breast: Secondary | ICD-10-CM

## 2017-07-11 ENCOUNTER — Other Ambulatory Visit: Payer: Self-pay | Admitting: Family Medicine

## 2017-07-11 ENCOUNTER — Other Ambulatory Visit (HOSPITAL_COMMUNITY)
Admission: RE | Admit: 2017-07-11 | Discharge: 2017-07-11 | Disposition: A | Discharge status: Home / Self Care | Payer: BC Managed Care – PPO | Source: Ambulatory Visit | Attending: Family Medicine | Admitting: Family Medicine

## 2017-07-11 DIAGNOSIS — Z124 Encounter for screening for malignant neoplasm of cervix: Secondary | ICD-10-CM | POA: Insufficient documentation

## 2017-07-17 LAB — CYTOLOGY - PAP: Diagnosis: NEGATIVE

## 2017-07-24 ENCOUNTER — Ambulatory Visit
Admission: RE | Admit: 2017-07-24 | Discharge: 2017-07-24 | Disposition: A | Discharge status: Home / Self Care | Payer: BC Managed Care – PPO | Source: Ambulatory Visit | Attending: Family Medicine | Admitting: Family Medicine

## 2017-07-24 DIAGNOSIS — Z1231 Encounter for screening mammogram for malignant neoplasm of breast: Secondary | ICD-10-CM

## 2017-07-25 ENCOUNTER — Other Ambulatory Visit: Payer: Self-pay | Admitting: Family Medicine

## 2017-07-25 ENCOUNTER — Other Ambulatory Visit: Payer: Self-pay | Admitting: Internal Medicine

## 2017-07-25 DIAGNOSIS — N644 Mastodynia: Secondary | ICD-10-CM

## 2017-08-04 ENCOUNTER — Other Ambulatory Visit: Payer: Self-pay | Admitting: Family Medicine

## 2017-08-04 ENCOUNTER — Ambulatory Visit
Admission: RE | Admit: 2017-08-04 | Discharge: 2017-08-04 | Disposition: A | Discharge status: Home / Self Care | Payer: BC Managed Care – PPO | Source: Ambulatory Visit | Attending: Family Medicine | Admitting: Family Medicine

## 2017-08-04 DIAGNOSIS — N644 Mastodynia: Secondary | ICD-10-CM

## 2017-08-11 ENCOUNTER — Ambulatory Visit: Payer: BC Managed Care – PPO

## 2018-05-01 ENCOUNTER — Emergency Department (HOSPITAL_COMMUNITY)
Admission: EM | Admit: 2018-05-01 | Discharge: 2018-05-01 | Disposition: A | Discharge status: Home / Self Care | Payer: BC Managed Care – PPO | Attending: Emergency Medicine | Admitting: Emergency Medicine

## 2018-05-01 ENCOUNTER — Other Ambulatory Visit: Payer: Self-pay

## 2018-05-01 ENCOUNTER — Emergency Department (HOSPITAL_COMMUNITY): Payer: BC Managed Care – PPO

## 2018-05-01 ENCOUNTER — Encounter (HOSPITAL_COMMUNITY): Payer: Self-pay | Admitting: Emergency Medicine

## 2018-05-01 DIAGNOSIS — S0001XA Abrasion of scalp, initial encounter: Secondary | ICD-10-CM | POA: Insufficient documentation

## 2018-05-01 DIAGNOSIS — S40211A Abrasion of right shoulder, initial encounter: Secondary | ICD-10-CM | POA: Insufficient documentation

## 2018-05-01 DIAGNOSIS — W010XXA Fall on same level from slipping, tripping and stumbling without subsequent striking against object, initial encounter: Secondary | ICD-10-CM | POA: Insufficient documentation

## 2018-05-01 DIAGNOSIS — T148XXA Other injury of unspecified body region, initial encounter: Secondary | ICD-10-CM

## 2018-05-01 DIAGNOSIS — Y9389 Activity, other specified: Secondary | ICD-10-CM | POA: Insufficient documentation

## 2018-05-01 DIAGNOSIS — S99922A Unspecified injury of left foot, initial encounter: Secondary | ICD-10-CM | POA: Diagnosis not present

## 2018-05-01 DIAGNOSIS — S0990XA Unspecified injury of head, initial encounter: Secondary | ICD-10-CM | POA: Diagnosis present

## 2018-05-01 DIAGNOSIS — Y9289 Other specified places as the place of occurrence of the external cause: Secondary | ICD-10-CM | POA: Insufficient documentation

## 2018-05-01 DIAGNOSIS — I10 Essential (primary) hypertension: Secondary | ICD-10-CM | POA: Diagnosis not present

## 2018-05-01 DIAGNOSIS — E119 Type 2 diabetes mellitus without complications: Secondary | ICD-10-CM | POA: Insufficient documentation

## 2018-05-01 DIAGNOSIS — Z79899 Other long term (current) drug therapy: Secondary | ICD-10-CM | POA: Diagnosis not present

## 2018-05-01 DIAGNOSIS — Y998 Other external cause status: Secondary | ICD-10-CM | POA: Diagnosis not present
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
## 2018-05-01 NOTE — Discharge Instructions (Addendum)
Tylenol and Motrin for pain.  Keep the areas clean and dry.  The CT scan and x-rays did not show any abnormalities.  Follow-up with your primary doctor for recheck and also a recheck of your blood pressure.

## 2018-07-30 ENCOUNTER — Other Ambulatory Visit: Payer: Self-pay | Admitting: Family Medicine

## 2018-07-30 DIAGNOSIS — Z1231 Encounter for screening mammogram for malignant neoplasm of breast: Secondary | ICD-10-CM

## 2018-08-06 ENCOUNTER — Ambulatory Visit
Admission: RE | Admit: 2018-08-06 | Discharge: 2018-08-06 | Disposition: A | Discharge status: Home / Self Care | Payer: BC Managed Care – PPO | Source: Ambulatory Visit | Attending: Family Medicine | Admitting: Family Medicine

## 2018-08-06 DIAGNOSIS — Z1231 Encounter for screening mammogram for malignant neoplasm of breast: Secondary | ICD-10-CM
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
## 2019-07-09 ENCOUNTER — Other Ambulatory Visit: Payer: Self-pay | Admitting: Cardiology

## 2019-07-09 DIAGNOSIS — Z20822 Contact with and (suspected) exposure to covid-19: Secondary | ICD-10-CM

## 2019-07-12 LAB — NOVEL CORONAVIRUS, NAA: SARS-CoV-2, NAA: NOT DETECTED

## 2019-10-02 ENCOUNTER — Ambulatory Visit: Payer: BC Managed Care – PPO | Attending: Internal Medicine

## 2019-10-02 DIAGNOSIS — Z23 Encounter for immunization: Secondary | ICD-10-CM | POA: Insufficient documentation

## 2019-10-23 ENCOUNTER — Ambulatory Visit: Payer: BC Managed Care – PPO | Attending: Internal Medicine

## 2019-10-23 DIAGNOSIS — Z23 Encounter for immunization: Secondary | ICD-10-CM

## 2019-10-23 NOTE — Progress Notes (Signed)
   Covid-19 Vaccination Clinic  Name:  MAKYLIE RIVERE    MRN: [id number] DOB: 1955/12/13  10/23/2019  Ms. Grieco was observed post Covid-19 immunization for 30 minutes based on pre-vaccination screening without incident. She was provided with Vaccine Information Sheet and instruction to access the V-Safe system.   Ms. Sanjose was instructed to call 911 with any severe reactions post vaccine: Marland Kitchen Difficulty breathing  . Swelling of face and throat  . A fast heartbeat  . A bad rash all over body  . Dizziness and weakness   Immunizations Administered    Name Date Dose VIS Date Route   Pfizer COVID-19 Vaccine 10/23/2019  8:35 AM 0.3 mL 07/16/2019 Intramuscular   Manufacturer: [employer], [employer]   Lot: [id number]   NDC: [address]-1

## 2019-12-17 ENCOUNTER — Other Ambulatory Visit: Payer: Self-pay | Admitting: Family Medicine

## 2019-12-17 DIAGNOSIS — Z1231 Encounter for screening mammogram for malignant neoplasm of breast: Secondary | ICD-10-CM

## 2019-12-21 ENCOUNTER — Ambulatory Visit
Admission: RE | Admit: 2019-12-21 | Discharge: 2019-12-21 | Disposition: A | Discharge status: Home / Self Care | Payer: BC Managed Care – PPO | Source: Ambulatory Visit | Attending: Family Medicine | Admitting: Family Medicine

## 2019-12-21 ENCOUNTER — Other Ambulatory Visit: Payer: Self-pay

## 2019-12-21 DIAGNOSIS — Z1231 Encounter for screening mammogram for malignant neoplasm of breast: Secondary | ICD-10-CM

## 2020-11-16 ENCOUNTER — Other Ambulatory Visit: Payer: Self-pay | Admitting: Family Medicine

## 2020-11-16 DIAGNOSIS — Z1231 Encounter for screening mammogram for malignant neoplasm of breast: Secondary | ICD-10-CM

## 2021-01-09 ENCOUNTER — Other Ambulatory Visit: Payer: Self-pay

## 2021-01-09 ENCOUNTER — Ambulatory Visit
Admission: RE | Admit: 2021-01-09 | Discharge: 2021-01-09 | Disposition: A | Discharge status: Home / Self Care | Payer: BC Managed Care – PPO | Source: Ambulatory Visit | Attending: Family Medicine | Admitting: Family Medicine

## 2021-01-09 DIAGNOSIS — Z1231 Encounter for screening mammogram for malignant neoplasm of breast: Secondary | ICD-10-CM

## 2021-06-12 ENCOUNTER — Emergency Department (HOSPITAL_COMMUNITY)
Admission: EM | Admit: 2021-06-12 | Discharge: 2021-06-12 | Disposition: A | Discharge status: Home / Self Care | Payer: BC Managed Care – PPO | Attending: Emergency Medicine | Admitting: Emergency Medicine

## 2021-06-12 ENCOUNTER — Other Ambulatory Visit: Payer: Self-pay

## 2021-06-12 ENCOUNTER — Encounter (HOSPITAL_COMMUNITY): Payer: Self-pay | Admitting: Emergency Medicine

## 2021-06-12 DIAGNOSIS — I10 Essential (primary) hypertension: Secondary | ICD-10-CM | POA: Insufficient documentation

## 2021-06-12 DIAGNOSIS — E119 Type 2 diabetes mellitus without complications: Secondary | ICD-10-CM | POA: Insufficient documentation

## 2021-06-12 DIAGNOSIS — R42 Dizziness and giddiness: Secondary | ICD-10-CM | POA: Diagnosis present

## 2021-06-12 DIAGNOSIS — R1033 Periumbilical pain: Secondary | ICD-10-CM

## 2021-06-12 DIAGNOSIS — R110 Nausea: Secondary | ICD-10-CM | POA: Diagnosis not present

## 2021-06-12 DIAGNOSIS — Z91040 Latex allergy status: Secondary | ICD-10-CM | POA: Diagnosis not present

## 2021-06-12 DIAGNOSIS — Z7984 Long term (current) use of oral hypoglycemic drugs: Secondary | ICD-10-CM | POA: Diagnosis not present

## 2021-06-12 LAB — COMPREHENSIVE METABOLIC PANEL
ALT: 27 U/L (ref 0–44)
AST: 25 U/L (ref 15–41)
Albumin: 3.5 g/dL (ref 3.5–5.0)
Alkaline Phosphatase: 87 U/L (ref 38–126)
Anion gap: 8 (ref 5–15)
BUN: 12 mg/dL (ref 8–23)
CO2: 24 mmol/L (ref 22–32)
Calcium: 8.7 mg/dL — ABNORMAL LOW (ref 8.9–10.3)
Chloride: 104 mmol/L (ref 98–111)
Creatinine, Ser: 0.82 mg/dL (ref 0.44–1.00)
GFR, Estimated: >60 mL/min (ref >60)
Glucose, Bld: 141 mg/dL — ABNORMAL HIGH (ref 70–99)
Potassium: 3.6 mmol/L (ref 3.5–5.1)
Sodium: 136 mmol/L (ref 135–145)
Total Bilirubin: 0.6 mg/dL (ref 0.3–1.2)
Total Protein: 7.3 g/dL (ref 6.5–8.1)

## 2021-06-12 LAB — CBC WITH DIFFERENTIAL/PLATELET
Abs Immature Granulocytes: 0.02 10*3/uL (ref 0.00–0.07)
Basophils Absolute: 0 10*3/uL (ref 0.0–0.1)
Basophils Relative: 1 %
Eosinophils Absolute: 0 10*3/uL (ref 0.0–0.5)
Eosinophils Relative: 1 %
HCT: 37.8 % (ref 36.0–46.0)
Hemoglobin: 12.3 g/dL (ref 12.0–15.0)
Immature Granulocytes: 0 %
Lymphocytes Relative: 24 %
Lymphs Abs: 1.2 10*3/uL (ref 0.7–4.0)
MCH: 27.5 pg (ref 26.0–34.0)
MCHC: 32.5 g/dL (ref 30.0–36.0)
MCV: 84.6 fL (ref 80.0–100.0)
Monocytes Absolute: 0.4 10*3/uL (ref 0.1–1.0)
Monocytes Relative: 7 %
Neutro Abs: 3.2 10*3/uL (ref 1.7–7.7)
Neutrophils Relative %: 67 %
Platelets: 202 10*3/uL (ref 150–400)
RBC: 4.47 MIL/uL (ref 3.87–5.11)
RDW: 12.7 % (ref 11.5–15.5)
WBC: 4.8 10*3/uL (ref 4.0–10.5)
nRBC: 0 % (ref 0.0–0.2)

## 2021-06-12 LAB — LIPASE, BLOOD: Lipase: 37 U/L (ref 11–51)

## 2021-06-12 NOTE — ED Notes (Signed)
Pt provided ginger ale for PO challenge  

## 2021-06-12 NOTE — ED Provider Notes (Signed)
[facility] EMERGENCY DEPARTMENT Provider Note   CSN: [id number] Arrival date & time: 06/12/21  1644     History Chief Complaint  Patient presents with   Abdominal Pain    Amber Newman is a 65 y.o. female with a past medical history of diabetes, hypertension presenting to the emergency department complaining of abdominal cramping onset prior to arrival.  Patient reports that she was at work at her desk when she began to feel dizzy.  Reports that she also felt nauseous and began to dry heave.  She reports that she attempted to eat pineapple following the incident however still had nausea and was dry heaving.  She reports that her abdominal pain radiating from periumbilical to epigastric region.  Patient noted that her abdominal pain was worse due to her corset bra. She notes that she was able to alleviate her abdominal cramping when she lifted her corset bra.  Patient was evaluated by EMS at the time and brought to the emergency department.  Patient has associated nausea.  Patient reports that her nausea and dizziness is worse with head movement.  Patient denies nausea at this time.  She has not tried any medications for his symptoms.  Patient denies fever, chills, chest pain, shortness of breath, lightheadedness, LOC, vomiting, ear pain, sore throat, rhinorrhea, congestion, dysuria, or hematuria.  Denies sick contacts.      The history is provided by the patient. No language interpreter was used.      Past Medical History:  Diagnosis Date   Diabetes mellitus type 2, diet-controlled ([facility]) 10/28/2013   Diabetes mellitus without complication ([place] [place])    HSV infection    HTN (hypertension) 10/28/2013   Hypertension     Patient Active Problem List   Diagnosis Date Noted   Anemia, iron deficiency 10/31/2013   Protein-calorie malnutrition, severe ([facility]) 10/29/2013   Supraclavicular mass 10/28/2013   HTN (hypertension) 10/28/2013   Diabetes mellitus type 2, diet-controlled  ([facility]) 10/28/2013   Dysphagia 10/28/2013   Speech abnormality 10/28/2013   Anemia 10/28/2013    Past Surgical History:  Procedure Laterality Date   EYE SURGERY     TONSILLECTOMY       OB History   No obstetric history on file.     Family History  Problem Relation Age of Onset   Breast cancer Paternal Aunt 32   Breast cancer Maternal Grandmother    Breast cancer Cousin 89    Social History   Tobacco Use   Smoking status: Never   Smokeless tobacco: Never  Substance Use Topics   Alcohol use: No    Home Medications Prior to Admission medications   Medication Sig Start Date End Date Taking? Authorizing Provider  dexamethasone (DECADRON) 4 MG tablet Take 1 tablet (4 mg total) by mouth 2 (two) times daily with a meal. Take 4 mg BID for 3 days, then 2 mg TID for next 3 days, then 2 mg bid for 3 days, then 2 mg daily for next 3 days then stop Patient not taking: Reported on 05/01/2018 10/31/13   Dhungel, Flonnie Overman, MD  ferrous gluconate (FERGON) 324 MG tablet Take 1 tablet (324 mg total) by mouth 2 (two) times daily with a meal. Patient not taking: Reported on 05/01/2018 10/31/13   Dhungel, Flonnie Overman, MD  guaiFENesin (MUCINEX) 600 MG 12 hr tablet Take 600 mg by mouth as needed for cough.    [provider]  Multiple Vitamin (MULTIVITAMIN) tablet Take 1 tablet by mouth daily. Alive  for Women 50+    [provider]  naproxen sodium (ALEVE) 220 MG tablet Take 220 mg by mouth daily as needed (headache).    [provider]  oxyCODONE-acetaminophen (ROXICET) 5-325 MG per tablet Take 1 tablet by mouth every 6 (six) hours as needed for severe pain. Patient not taking: Reported on 05/01/2018 10/31/13   Dhungel, Flonnie Overman, MD  pantoprazole (PROTONIX) 40 MG tablet Take 1 tablet (40 mg total) by mouth daily. Switch for any other PPI at similar dose and frequency Patient not taking: Reported on 05/01/2018 10/31/13   Dhungel, Flonnie Overman, MD  vitamin C (ASCORBIC ACID) 500 MG tablet  Take 500 mg by mouth daily. Chewable tablets    [provider]    Allergies    Codeine, Latex, Penicillins, and Sulfa antibiotics  Review of Systems   Review of Systems  Constitutional:  Negative for chills and fever.  HENT:  Negative for congestion, ear pain, rhinorrhea and sore throat.   Respiratory:  Negative for shortness of breath.   Cardiovascular:  Negative for chest pain.  Gastrointestinal:  Positive for abdominal pain and nausea. Negative for vomiting.  Genitourinary:  Negative for dysuria and hematuria.  Musculoskeletal:  Negative for joint swelling.  Skin:  Negative for rash.  Neurological:  Positive for dizziness. Negative for syncope and light-headedness.   Physical Exam Updated Vital Signs BP (!) 158/87 (BP Location: Right Arm)   Pulse 63   Temp 98.3 F (36.8 C)   Resp 18   Ht 5\' 5"  (1.651 m)   Wt 104.3 kg   SpO2 100%   BMI 38.27 kg/m   Physical Exam Vitals and nursing note reviewed.  Constitutional:      General: She is not in acute distress.    Appearance: She is well-developed. She is not diaphoretic.     Comments: Awake, alert, nontoxic appearance  HENT:     Head: Normocephalic and atraumatic.     Right Ear: Tympanic membrane, ear canal and external ear normal.     Left Ear: Tympanic membrane, ear canal and external ear normal.     Nose: Nose normal. No congestion or rhinorrhea.     Mouth/Throat:     Mouth: Mucous membranes are moist.     Pharynx: Oropharynx is clear. No oropharyngeal exudate or posterior oropharyngeal erythema.  Eyes:     General: No scleral icterus.    Extraocular Movements: Extraocular movements intact.     Conjunctiva/sclera: Conjunctivae normal.     Pupils: Pupils are equal, round, and reactive to light.  Cardiovascular:     Rate and Rhythm: Normal rate and regular rhythm.     Pulses: Normal pulses.          Radial pulses are 2+ on the right side and 2+ on the left side.       Dorsalis pedis pulses are 2+ on the  right side and 2+ on the left side.       Posterior tibial pulses are 2+ on the right side and 2+ on the left side.     Heart sounds: Normal heart sounds.  Pulmonary:     Effort: Pulmonary effort is normal. No respiratory distress.     Breath sounds: Normal breath sounds. No wheezing.  Abdominal:     General: Bowel sounds are normal.     Palpations: Abdomen is soft. There is no mass.     Tenderness: There is no abdominal tenderness. There is no guarding or rebound.  Musculoskeletal:  General: Normal range of motion.     Cervical back: Normal range of motion and neck supple.     Comments: Able to ambulate without difficulty or assistance.  Skin:    General: Skin is warm and dry.  Neurological:     General: No focal deficit present.     Mental Status: She is alert.     Cranial Nerves: Cranial nerves 2-12 are intact.     Sensory: Sensation is intact.     Motor: Motor function is intact.     Coordination: Coordination normal.     Gait: Gait normal.     Comments: Speech is clear and goal oriented.  Strength and sensation intact to bilateral upper and lower extremities.  Psychiatric:        Mood and Affect: Mood normal.        Behavior: Behavior normal.    ED Results / Procedures / Treatments   Labs (all labs ordered are listed, but only abnormal results are displayed) Labs Reviewed  COMPREHENSIVE METABOLIC PANEL - Abnormal; Notable for the following components:      Result Value   Glucose, Bld 141 (*)    Calcium 8.7 (*)    All other components within normal limits  CBC WITH DIFFERENTIAL/PLATELET  LIPASE, BLOOD  CBG MONITORING, ED    EKG None  Radiology No results found.  Procedures Procedures   Medications Ordered in ED Medications - No data to display  ED Course  I have reviewed the triage vital signs and the nursing notes.  Pertinent labs & imaging results that were available during my care of the patient were reviewed by me and considered in my medical  decision making (see chart for details).  7:54 PM -patient resting comfortably on the stretcher.  Patient reevaluated and patient able to ambulate without assistance or difficulty.  8:42 PM -patient p.o. challenged and tolerated without difficulty.  Patient sitting comfortably on stretcher.  Patient appears safe for discharge at this time.   MDM Rules/Calculators/A&P                           Patient presents with periumbilical abdominal pain radiating to epigastric region onset prior to arrival.  Patient also with dizziness, nausea, and dry heaving.  Patient dizziness and nausea worse with movement.  Patient with similar symptoms in the past with prior inner ear infections.  Patient denies sick contacts or recent illnesses.  Patient recently evaluated within the past month by dentist for right jaw pain.  No neurological deficits and normal neuro exam.  Patient afebrile with oxygen saturation 100%. Offered the patient antiemetic and meclizine while in the ED.  Patient declines at this time due to being asymptomatic. Differential diagnosis includes CVA, TIA, BPPV, anxiety attack, or MI.  EKG without acute ST or T changes, doubt MI at this time.  Lipase, CBC, CMP, unremarkable. Normal neurological exam, doubt CVA, TIA at this time.  Patient with history of similar symptoms with prior inner ear concerns.  Patient with right jaw pain recently evaluated by her dentist within the past month. This is likely BPPV.    Work note provided. Offered patient antiemetic and meclizine, patient declines at this time. Strict return precautions discussed with patient today patient acknowledges and verbalizes understanding.  Pt appears safe discharge at this time.  Follow-up as indicated in discharge paperwork., patient declines at this time.    Final Clinical Impression(s) / ED Diagnoses Final diagnoses:  Dizziness  Periumbilical abdominal pain    Rx / DC Orders ED Discharge Orders     None        Maahir Horst,  Norene Oliveri A, PA-C 06/12/21 2056    Melene Plan, DO 06/12/21 2247

## 2021-06-12 NOTE — ED Provider Notes (Signed)
Emergency Medicine Provider Triage Evaluation Note  Amber Newman , a 65 y.o. female  was evaluated in triage.  Pt complains of nausea and dry heaving.  She states that she was at work today when she was seated and suddenly felt dizzy.  She then felt like she was going to have a panic attack and had abdominal cramping.  She states that she went to the bathroom but did not have a bowel movement.  She states that she then began having severe dry heaving without vomiting.  She states that since then she has had continued nausea and dry heaving.  She describes a moderate abdominal pain that feels like cramping or "gripping."  She points to her mid abdomen, periumbilical that at times radiates to the epigastric region.  She states that when she is changing positions she feels like everything is spinning around her which slowly resolves as she sits still.  She denies any recent illnesses, fever, diarrhea, chest pain or shortness of breath.  Denies urinary symptoms. Review of Systems  Positive: Abdominal pain, nausea, dry heaving Negative: Fever  Physical Exam  BP (!) 158/87 (BP Location: Right Arm)   Pulse 63   Temp 98.3 F (36.8 C)   Resp 18   Ht 5\' 5"  (1.651 m)   Wt 104.3 kg   SpO2 100%   BMI 38.27 kg/m  Gen:   Awake, no distress   Resp:  Normal effort  MSK:   Moves extremities without difficulty  Other:  Abdomen is rounded, soft, tenderness to palpation of the epigastric and periumbilical region.  No rebound tenderness.  Negative Murphy's, negative McBurney's. S1/S2 without murmurs. No focal neurological deficits.  Cranial nerves intact.  No nystagmus.  Finger-to-nose within normal limits.  Rapid alternating movements intact.  Medical Decision Making  Medically screening exam initiated at 5:07 PM.  Appropriate orders placed.  Amber Newman was informed that the remainder of the evaluation will be completed by another provider, this initial triage assessment does not replace that evaluation,  and the importance of remaining in the ED until their evaluation is complete.     Marko Stai, PA-C 06/12/21 1711    13/08/22, MD 06/13/21 [phone]

## 2021-06-12 NOTE — ED Triage Notes (Signed)
Patient c/o epigastric pain with nausea onset of 2 today after eating can of pineapple for lunch. Pain is intermittent and feels like a gripping sensation.

## 2021-06-12 NOTE — Discharge Instructions (Addendum)
Return to the emergency department if you are experiencing persistent or worsening dizziness, vision changes, nausea, vomiting, or unable to keep down fluids.

## 2021-09-15 IMAGING — MG MM DIGITAL SCREENING BILAT W/ TOMO AND CAD
6 of 12 series · 6 of 36 positions shown · non-contrast
Comparison: Previous exam(s).

ACR Breast Density Category a: The breast tissue is almost entirely
fatty.

CLINICAL DATA: Screening.

EXAM:
DIGITAL SCREENING BILATERAL MAMMOGRAM WITH TOMOSYNTHESIS AND CAD
TECHNIQUE: Bilateral screening digital craniocaudal and mediolateral oblique
mammograms were obtained. Bilateral screening digital breast
tomosynthesis was performed. The images were evaluated with
computer-aided detection.

[R MLO synth-2D]
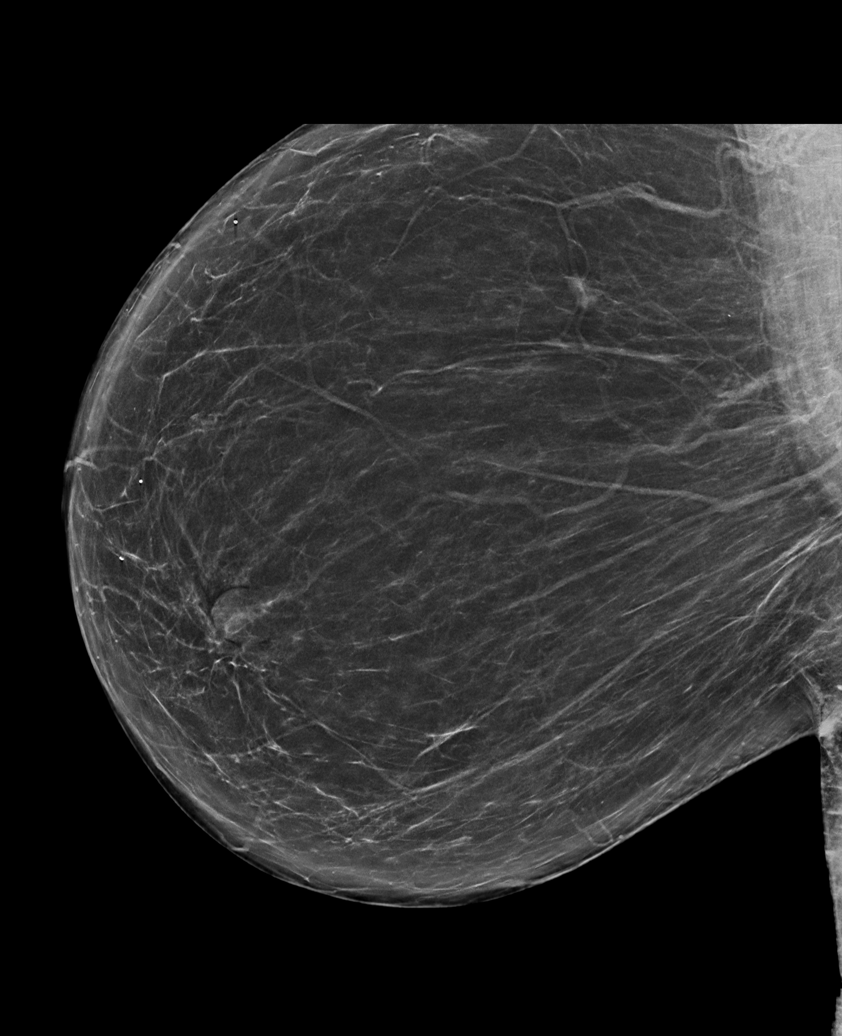

[R CC synth-2D (1 of 2)]
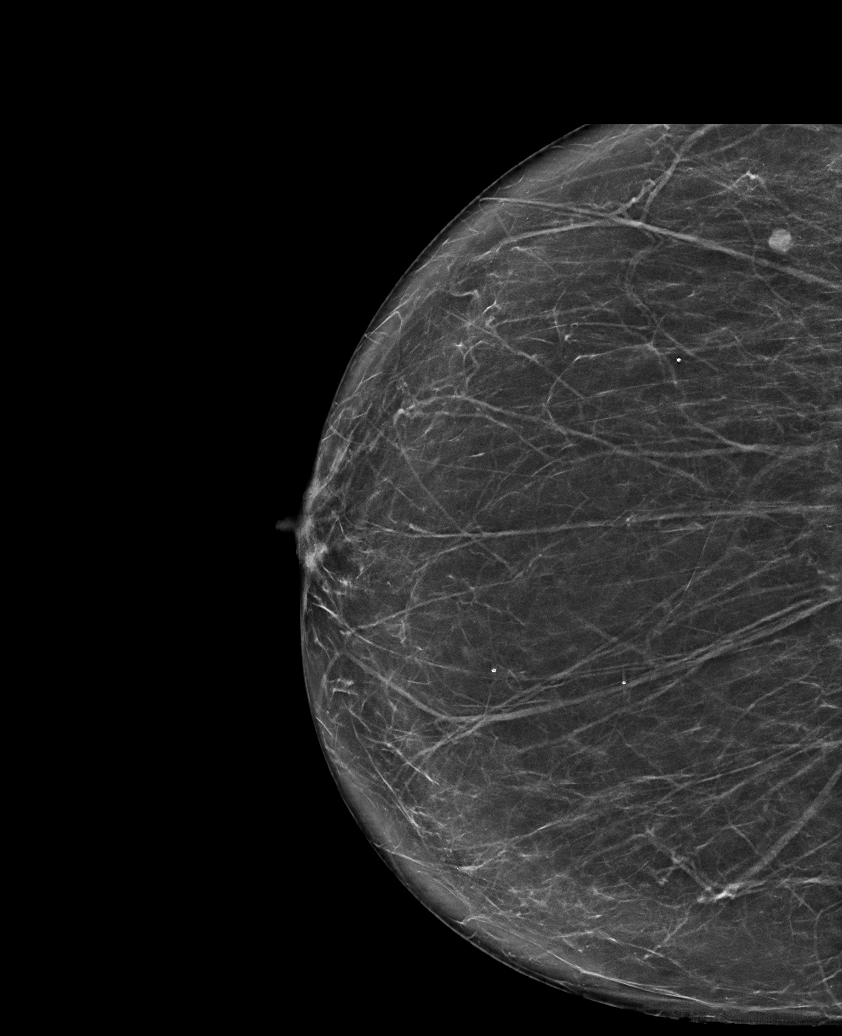

[R CC synth-2D (2 of 2)]
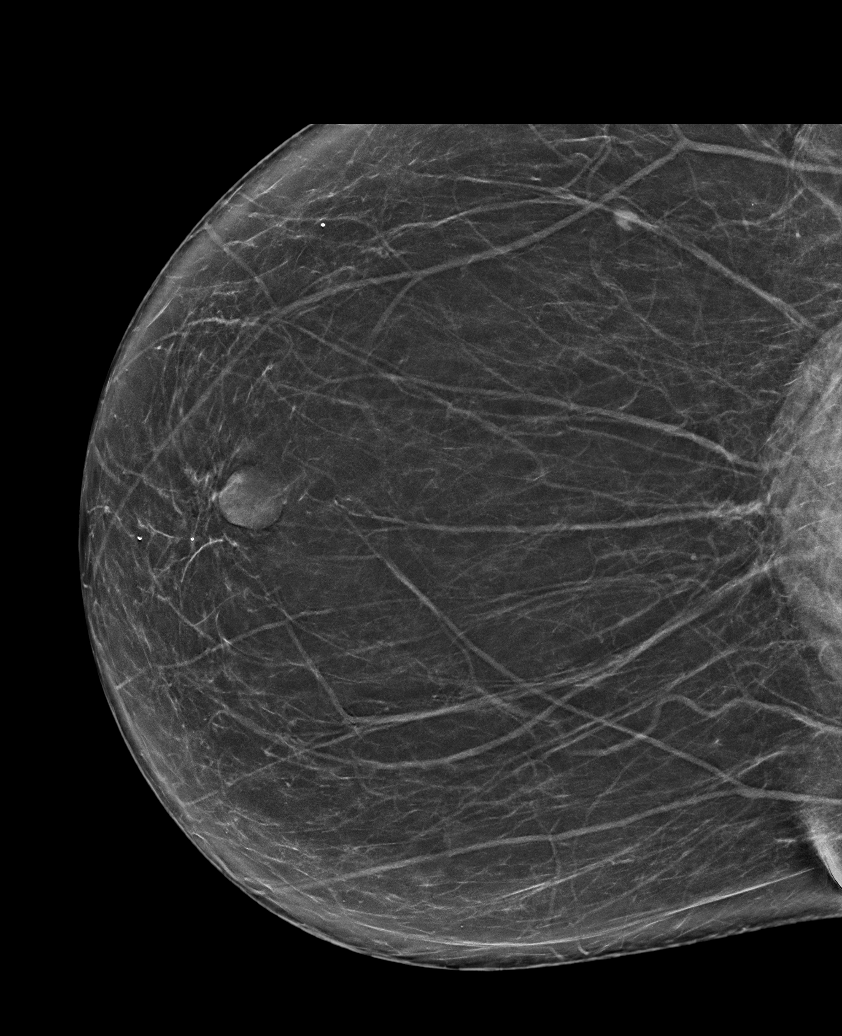

[L CC synth-2D (1 of 2)]
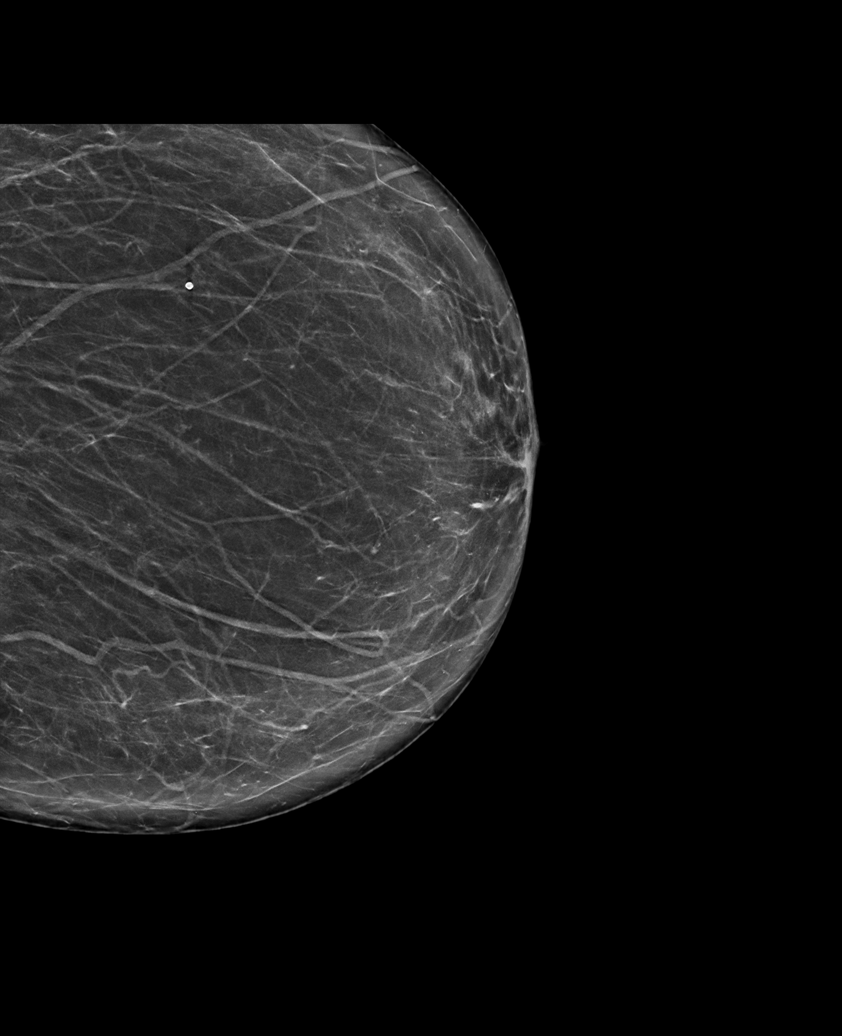

[L CC synth-2D (2 of 2)]
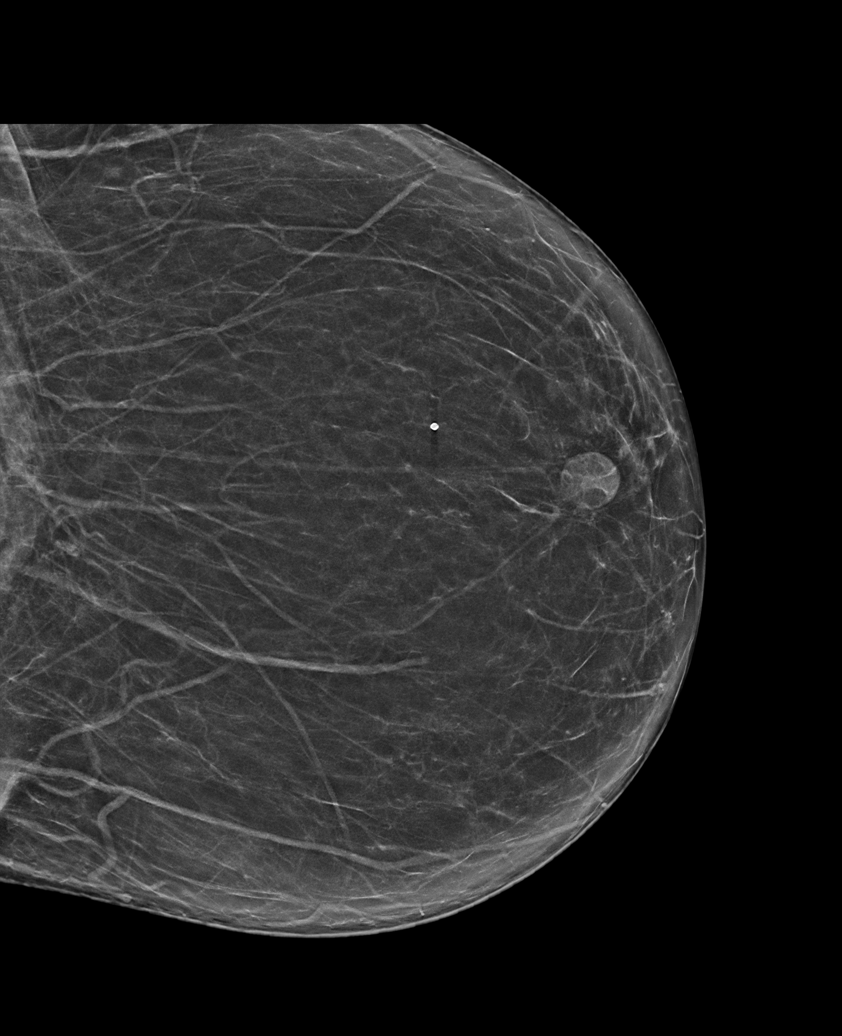

[L MLO synth-2D]
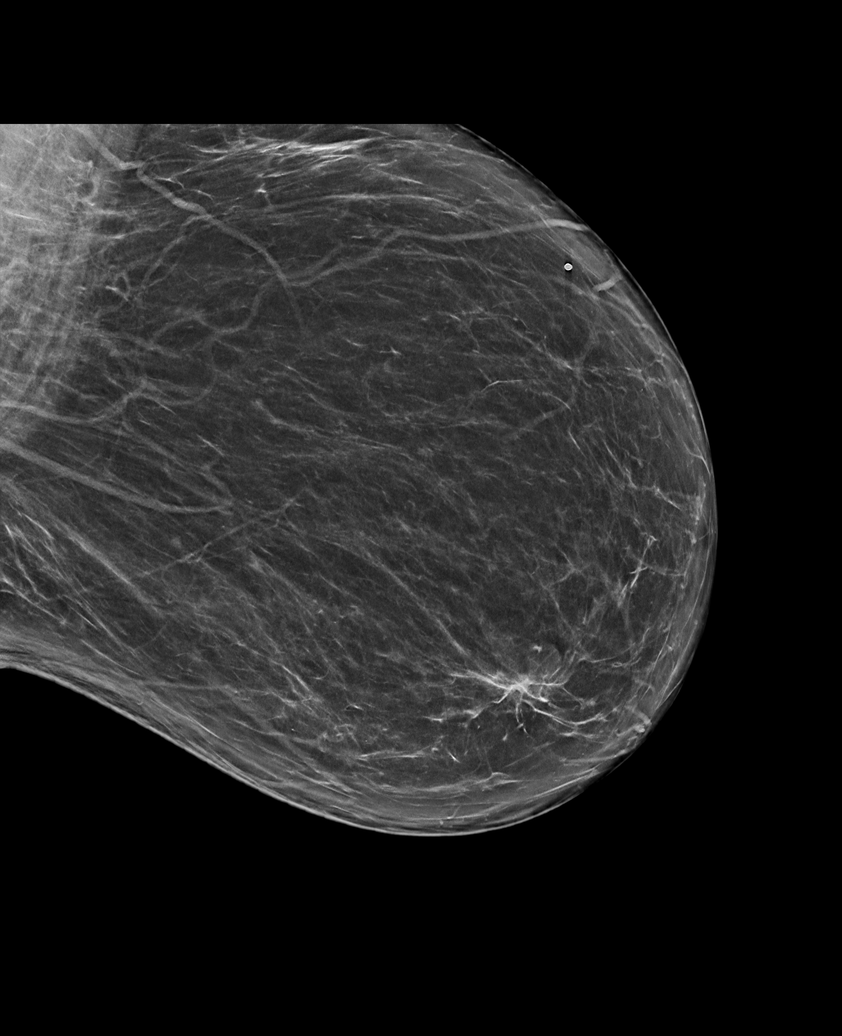

[6 of 36 positions shown; findings below may reference images not displayed]

FINDINGS: There are no findings suspicious for malignancy. The images were
evaluated with computer-aided detection.
IMPRESSION: No mammographic evidence of malignancy. A result letter of this
screening mammogram will be mailed directly to the patient.

RECOMMENDATION:
Screening mammogram in one year. (Code:[id number]-J-DD5)

BI-RADS CATEGORY  1: Negative.

## 2022-01-24 ENCOUNTER — Other Ambulatory Visit: Payer: Self-pay | Admitting: Family Medicine

## 2022-01-24 DIAGNOSIS — Z1231 Encounter for screening mammogram for malignant neoplasm of breast: Secondary | ICD-10-CM

## 2022-01-29 ENCOUNTER — Ambulatory Visit: Payer: BC Managed Care – PPO

## 2022-01-30 ENCOUNTER — Ambulatory Visit
Admission: RE | Admit: 2022-01-30 | Discharge: 2022-01-30 | Disposition: A | Discharge status: Home / Self Care | Payer: BC Managed Care – PPO | Source: Ambulatory Visit | Attending: Family Medicine | Admitting: Family Medicine

## 2022-01-30 DIAGNOSIS — Z1231 Encounter for screening mammogram for malignant neoplasm of breast: Secondary | ICD-10-CM

## 2022-10-31 ENCOUNTER — Other Ambulatory Visit: Payer: Self-pay | Admitting: Family Medicine

## 2022-10-31 DIAGNOSIS — Z1231 Encounter for screening mammogram for malignant neoplasm of breast: Secondary | ICD-10-CM

## 2023-02-03 ENCOUNTER — Ambulatory Visit
Admission: RE | Admit: 2023-02-03 | Discharge: 2023-02-03 | Disposition: A | Discharge status: Home / Self Care | Payer: BC Managed Care – PPO | Source: Ambulatory Visit | Attending: Family Medicine | Admitting: Family Medicine

## 2023-02-03 DIAGNOSIS — Z1231 Encounter for screening mammogram for malignant neoplasm of breast: Secondary | ICD-10-CM

## 2023-08-05 DIAGNOSIS — Z6837 Body mass index (BMI) 37.0-37.9, adult: Secondary | ICD-10-CM | POA: Diagnosis not present

## 2023-08-05 DIAGNOSIS — E1122 Type 2 diabetes mellitus with diabetic chronic kidney disease: Secondary | ICD-10-CM | POA: Diagnosis not present

## 2023-08-05 DIAGNOSIS — N1831 Chronic kidney disease, stage 3a: Secondary | ICD-10-CM | POA: Diagnosis not present

## 2023-08-05 DIAGNOSIS — E1169 Type 2 diabetes mellitus with other specified complication: Secondary | ICD-10-CM | POA: Diagnosis not present

## 2023-08-05 DIAGNOSIS — I10 Essential (primary) hypertension: Secondary | ICD-10-CM | POA: Diagnosis not present

## 2023-12-09 ENCOUNTER — Other Ambulatory Visit: Payer: Self-pay | Admitting: Family Medicine

## 2023-12-09 DIAGNOSIS — Z1231 Encounter for screening mammogram for malignant neoplasm of breast: Secondary | ICD-10-CM

## 2023-12-31 DIAGNOSIS — E611 Iron deficiency: Secondary | ICD-10-CM | POA: Diagnosis not present

## 2023-12-31 DIAGNOSIS — Z6839 Body mass index (BMI) 39.0-39.9, adult: Secondary | ICD-10-CM | POA: Diagnosis not present

## 2023-12-31 DIAGNOSIS — Z0000 Encounter for general adult medical examination without abnormal findings: Secondary | ICD-10-CM | POA: Diagnosis not present

## 2023-12-31 DIAGNOSIS — Z23 Encounter for immunization: Secondary | ICD-10-CM | POA: Diagnosis not present

## 2023-12-31 DIAGNOSIS — Z8639 Personal history of other endocrine, nutritional and metabolic disease: Secondary | ICD-10-CM | POA: Diagnosis not present

## 2023-12-31 DIAGNOSIS — Z1159 Encounter for screening for other viral diseases: Secondary | ICD-10-CM | POA: Diagnosis not present

## 2023-12-31 DIAGNOSIS — E7800 Pure hypercholesterolemia, unspecified: Secondary | ICD-10-CM | POA: Diagnosis not present

## 2023-12-31 DIAGNOSIS — E1169 Type 2 diabetes mellitus with other specified complication: Secondary | ICD-10-CM | POA: Diagnosis not present

## 2023-12-31 DIAGNOSIS — I10 Essential (primary) hypertension: Secondary | ICD-10-CM | POA: Diagnosis not present

## 2024-02-05 ENCOUNTER — Ambulatory Visit
Admission: RE | Admit: 2024-02-05 | Discharge: 2024-02-05 | Disposition: A | Discharge status: Home / Self Care | Payer: Self-pay | Source: Ambulatory Visit | Attending: Family Medicine | Admitting: Family Medicine

## 2024-02-05 DIAGNOSIS — Z1231 Encounter for screening mammogram for malignant neoplasm of breast: Secondary | ICD-10-CM

## 2024-02-11 ENCOUNTER — Other Ambulatory Visit: Payer: Self-pay | Admitting: Family Medicine

## 2024-02-11 DIAGNOSIS — R928 Other abnormal and inconclusive findings on diagnostic imaging of breast: Secondary | ICD-10-CM

## 2024-02-17 ENCOUNTER — Ambulatory Visit
Admission: RE | Admit: 2024-02-17 | Discharge: 2024-02-17 | Disposition: A | Discharge status: Home / Self Care | Source: Ambulatory Visit | Attending: Family Medicine | Admitting: Family Medicine

## 2024-02-17 ENCOUNTER — Other Ambulatory Visit: Payer: Self-pay | Admitting: Family Medicine

## 2024-02-17 DIAGNOSIS — R921 Mammographic calcification found on diagnostic imaging of breast: Secondary | ICD-10-CM

## 2024-02-17 DIAGNOSIS — R928 Other abnormal and inconclusive findings on diagnostic imaging of breast: Secondary | ICD-10-CM

## 2024-02-23 ENCOUNTER — Ambulatory Visit
Admission: RE | Admit: 2024-02-23 | Discharge: 2024-02-23 | Disposition: A | Discharge status: Home / Self Care | Source: Ambulatory Visit | Attending: Family Medicine | Admitting: Family Medicine

## 2024-02-23 DIAGNOSIS — R921 Mammographic calcification found on diagnostic imaging of breast: Secondary | ICD-10-CM | POA: Diagnosis not present

## 2024-02-23 DIAGNOSIS — N6489 Other specified disorders of breast: Secondary | ICD-10-CM | POA: Diagnosis not present

## 2024-02-23 HISTORY — PX: BREAST BIOPSY: SHX20

## 2024-02-24 DIAGNOSIS — I10 Essential (primary) hypertension: Secondary | ICD-10-CM | POA: Diagnosis not present

## 2024-02-24 DIAGNOSIS — Z1211 Encounter for screening for malignant neoplasm of colon: Secondary | ICD-10-CM | POA: Diagnosis not present

## 2024-02-24 DIAGNOSIS — E782 Mixed hyperlipidemia: Secondary | ICD-10-CM | POA: Diagnosis not present

## 2024-02-24 DIAGNOSIS — Z800 Family history of malignant neoplasm of digestive organs: Secondary | ICD-10-CM | POA: Diagnosis not present

## 2024-02-24 LAB — SURGICAL PATHOLOGY

## 2024-03-17 ENCOUNTER — Encounter: Payer: Self-pay | Admitting: Cardiology

## 2024-03-17 ENCOUNTER — Ambulatory Visit: Attending: Cardiology

## 2024-03-17 ENCOUNTER — Ambulatory Visit: Attending: Cardiology | Admitting: Cardiology

## 2024-03-17 VITALS — BP 116/74 | HR 101 | Ht 65.0 in | Wt 230.8 lb

## 2024-03-17 DIAGNOSIS — R002 Palpitations: Secondary | ICD-10-CM | POA: Diagnosis not present

## 2024-03-17 DIAGNOSIS — R0789 Other chest pain: Secondary | ICD-10-CM | POA: Diagnosis not present

## 2024-03-17 DIAGNOSIS — R0609 Other forms of dyspnea: Secondary | ICD-10-CM | POA: Diagnosis not present

## 2024-03-17 DIAGNOSIS — R072 Precordial pain: Secondary | ICD-10-CM

## 2024-03-17 DIAGNOSIS — I10 Essential (primary) hypertension: Secondary | ICD-10-CM

## 2024-03-17 DIAGNOSIS — R9431 Abnormal electrocardiogram [ECG] [EKG]: Secondary | ICD-10-CM | POA: Diagnosis not present

## 2024-03-17 MED ORDER — METOPROLOL TARTRATE 100 MG PO TABS: ORAL_TABLET | ORAL | 0 refills | Status: DC

## 2024-03-17 MED ORDER — ASPIRIN 81 MG PO TBEC
81.0000 mg | DELAYED_RELEASE_TABLET | Freq: Every day | ORAL | Status: AC
Start: 2024-03-17 — End: ?

## 2024-03-17 NOTE — Patient Instructions (Addendum)
 Medication Instructions:   START: Aspirin  81mg  1 tablet daily  START: Metoprolol  100mg  1 tablet 2 hours prior to CT scan   Lab Work: None Ordered If you have labs (blood work) drawn today and your tests are completely normal, you will receive your results only by: MyChart Message (if you have MyChart) OR A paper copy in the mail If you have any lab test that is abnormal or we need to change your treatment, we will call you to review the results.   Testing/Procedures:   WHY IS MY DOCTOR PRESCRIBING ZIO? The Zio system is proven and trusted by physicians to detect and diagnose irregular heart rhythms -- and has been prescribed to hundreds of thousands of patients.  The FDA has cleared the Zio system to monitor for many different kinds of irregular heart rhythms. In a study, physicians were able to reach a diagnosis 90% of the time with the Zio system1.  You can wear the Zio monitor -- a small, discreet, comfortable patch -- during your normal day-to-day activity, including while you sleep, shower, and exercise, while it records every single heartbeat for analysis.  1Barrett, P., et al. Comparison of 24 Hour Holter Monitoring Versus 14 Day Novel Adhesive Patch Electrocardiographic Monitoring. American Journal of Medicine, 2014.  ZIO VS. HOLTER MONITORING The Zio monitor can be comfortably worn for up to 14 days. Holter monitors can be worn for 24 to 48 hours, limiting the time to record any irregular heart rhythms you may have. Zio is able to capture data for the 51% of patients who have their first symptom-triggered arrhythmia after 48 hours.1  LIVE WITHOUT RESTRICTIONS The Zio ambulatory cardiac monitor is a small, unobtrusive, and water-resistant patch--you might even forget you're wearing it. The Zio monitor records and stores every beat of your heart, whether you're sleeping, working out, or showering.    Your physician has requested that you have an echocardiogram.  Echocardiography is a painless test that uses sound waves to create images of your heart. It provides your doctor with information about the size and shape of your heart and how well your heart's chambers and valves are working. This procedure takes approximately one hour. There are no restrictions for this procedure. Please do NOT wear cologne, perfume, aftershave, or lotions (deodorant is allowed). Please arrive 15 minutes prior to your appointment time.  Please note: We ask at that you not bring children with you during ultrasound (echo/ vascular) testing. Due to room size and safety concerns, children are not allowed in the ultrasound rooms during exams. Our front office staff cannot provide observation of children in our lobby area while testing is being conducted. An adult accompanying a patient to their appointment will only be allowed in the ultrasound room at the discretion of the ultrasound technician under special circumstances. We apologize for any inconvenience.   Your cardiac CT will be scheduled at one of the below locations:   [facility] [address] [place], [place] [address]  Please follow these instructions carefully (unless otherwise directed):    On the Night Before the Test: Be sure to Drink plenty of water. Do not consume any caffeinated/decaffeinated beverages or chocolate 12 hours prior to your test. Do not take any antihistamines 12 hours prior to your test.  On the Day of the Test: Drink plenty of water until 1 hour prior to the test. Do not eat any food 4 hours prior to the test. You may take your regular medications prior  to the test.  Take metoprolol  (Lopressor ) two hours prior to test. HOLD Furosemide/Hydrochlorothiazide morning of the test. FEMALES- please wear underwire-free bra if available, avoid dresses & tight clothing        After the Test: Drink plenty of water. After receiving IV contrast, you may experience a mild flushed feeling.  This is normal. On occasion, you may experience a mild rash up to 24 hours after the test. This is not dangerous. If this occurs, you can take Benadryl 25 mg and increase your fluid intake. If you experience trouble breathing, this can be serious. If it is severe call 911 IMMEDIATELY. If it is mild, please call our office. If you take any of these medications: Glipizide/Metformin, Avandament, Glucavance, please do not take 48 hours after completing test unless otherwise instructed.  We will call to schedule your test 2-4 weeks out understanding that some insurance companies will need an authorization prior to the service being performed.   For non-scheduling related questions, please contact the cardiac imaging nurse navigator should you have any questions/concerns: Camie Shutter, Cardiac Imaging Nurse Navigator Chantal Requena, Cardiac Imaging Nurse Navigator [facility] Direct Office Dial: [phone]   For scheduling needs, including cancellations and rescheduling, please call [place], [phone].    Follow-Up: At [facility], you and your health needs are our priority.  As part of our continuing mission to provide you with exceptional heart care, we have created designated Provider Care Teams.  These Care Teams include your primary Cardiologist (physician) and Advanced Practice Providers (APPs -  Physician Assistants and Nurse Practitioners) who all work together to provide you with the care you need, when you need it.  We recommend signing up for the patient portal called MyChart.  Sign up information is provided on this After Visit Summary.  MyChart is used to connect with patients for Virtual Visits (Telemedicine).  Patients are able to view lab/test results, encounter notes, upcoming appointments, etc.  Non-urgent messages can be sent to your provider as well.   To learn more about what you can do with MyChart, go to [email or web address].    Your  next appointment:   2 month(s)  The format for your next appointment:   In Person  Provider:   Lamar Fitch, MD    Other Instructions NA

## 2024-03-17 NOTE — Progress Notes (Signed)
 Cardiology Consultation:    Date:  03/17/2024   ID:  Amber Newman, DOB 04/16/1956, MRN [id number]  PCP:  Okey Carlin Redbird, MD  Cardiologist:  Lamar Fitch, MD   Referring MD: Okey Carlin Redbird, MD   No chief complaint on file.   History of Present Illness:    Amber Newman is a 68 y.o. female who is being seen today for the evaluation of palpitations dyspnea on exertion at the request of Okey Carlin Redbird, MD. past medical history significant for diabetes, essential hypertension, dyslipidemia she was referred to me because of episode of palpitations.  She described to have skipped beats as well as some more sustained arrhythmia but not lasting long.  It make her feel uneasy and scared.  Also described to have some shortness of breath with exertion.  Sometimes uneasy sensation in her chest when she does this.  Does not exercise on the regular basis.  She lost her mother father few years ago she said it was extremely stressful situation.  She described to me previously multiple evaluations for her in the hospital and she ended up with diagnosis of anxiety.  She is not on any special diet.  She takes a statin already.  Past Medical History:  Diagnosis Date   Diabetes mellitus type 2, diet-controlled (HCC) 10/28/2013   Diabetes mellitus without complication (HCC)    HSV infection    HTN (hypertension) 10/28/2013   Hypertension     Past Surgical History:  Procedure Laterality Date   BREAST BIOPSY Right 02/23/2024   MM RT BREAST BX W LOC DEV 1ST LESION IMAGE BX SPEC STEREO GUIDE 02/23/2024 [facility]   EYE SURGERY     TONSILLECTOMY      Current Medications: Current Meds  Medication Sig   amLODipine  (NORVASC ) 5 MG tablet Take 5 mg by mouth daily.   Ascorbic Acid 500 MG CAPS 2 chewables daily   aspirin  EC 81 MG tablet Take 1 tablet (81 mg total) by mouth daily. Swallow whole.   guaiFENesin (MUCINEX) 600 MG 12 hr tablet Take 600 mg by mouth as needed for cough.    losartan-hydrochlorothiazide (HYZAAR) 100-12.5 MG tablet Take 1 tablet by mouth daily.   metoprolol  tartrate (LOPRESSOR ) 100 MG tablet Take one tablet 2 hours before cardiac CT for heart greater than 55   Multiple Vitamin (MULTIVITAMIN) tablet Take 1 tablet by mouth daily. Alive for Women 50+   Multiple Vitamins-Minerals (ALIVE WOMENS 50+ PO) 1 cap daily   Omega 3 1000 MG CAPS 2 caps daily   pravastatin (PRAVACHOL) 20 MG tablet Take 20 mg by mouth once a week.   Probiotic CHEW 1 tab daily   vitamin C (ASCORBIC ACID) 500 MG tablet Take 500 mg by mouth daily. Chewable tablets   vitamin E 1000 UNIT capsule Take 1,000 Units by mouth daily.   [DISCONTINUED] naproxen sodium (ALEVE) 220 MG tablet Take 220 mg by mouth daily as needed (headache).     Allergies:   Codeine, Latex, Penicillins, and Sulfa antibiotics   Social History   Socioeconomic History   Marital status: Single    Spouse name: Not on file   Number of children: Not on file   Years of education: Not on file   Highest education level: Not on file  Occupational History   Not on file  Tobacco Use   Smoking status: Never   Smokeless tobacco: Never  Substance and Sexual Activity   Alcohol use: No   Drug use:  Not on file   Sexual activity: Not on file  Other Topics Concern   Not on file  Social History Narrative   Not on file   Social Drivers of Health   Financial Resource Strain: Not on file  Food Insecurity: Not on file  Transportation Needs: Not on file  Physical Activity: Not on file  Stress: Not on file  Social Connections: Not on file     Family History: The patient's family history includes Breast cancer in her maternal grandmother; Breast cancer (age of onset: 40) in her cousin; Breast cancer (age of onset: 36) in her paternal aunt; Breast cancer (age of onset: 43) in her niece. ROS:   Please see the history of present illness.    All 14 point review of systems negative except as described per history of  present illness.  EKGs/Labs/Other Studies Reviewed:    The following studies were reviewed today:   EKG:  EKG Interpretation Date/Time:  Wednesday March 17 2024 10:53:13 EDT Ventricular Rate:  101 PR Interval:  198 QRS Duration:  84 QT Interval:  348 QTC Calculation: 451 R Axis:   -8  Text Interpretation: Sinus tachycardia Possible Left atrial enlargement Possible Inferior infarct , age undetermined No previous ECGs available Confirmed by Bernie Charleston [id number]) on 03/17/2024 10:58:21 AM    Recent Labs: No results found for requested labs within last 365 days.  Recent Lipid Panel No results found for: CHOL, TRIG, HDL, CHOLHDL, VLDL, LDLCALC, LDLDIRECT  Physical Exam:    VS:  BP 116/74   Pulse (!) 101   Ht 5' 5 (1.651 m)   Wt 230 lb 12.8 oz (104.7 kg)   SpO2 99%   BMI 38.41 kg/m     Wt Readings from Last 3 Encounters:  03/17/24 230 lb 12.8 oz (104.7 kg)  06/12/21 230 lb (104.3 kg)  05/01/18 230 lb (104.3 kg)     GEN:  Well nourished, well developed in no acute distress HEENT: Normal NECK: No JVD; No carotid bruits LYMPHATICS: No lymphadenopathy CARDIAC: RRR, no murmurs, no rubs, no gallops RESPIRATORY:  Clear to auscultation without rales, wheezing or rhonchi  ABDOMEN: Soft, non-tender, non-distended MUSCULOSKELETAL:  No edema; No deformity  SKIN: Warm and dry NEUROLOGIC:  Alert and oriented x 3 PSYCHIATRIC:  Normal affect   ASSESSMENT:    1. Nonspecific abnormal electrocardiogram (ECG) (EKG)   2. Palpitations   3. Primary hypertension   4. Dyspnea on exertion   5. Atypical chest pain   6. Precordial pain    PLAN:    In order of problems listed above:  Her EKG is abnormal showing possibility of inferior wall myocardial infarction.  Will get echocardiogram to assess left ventricle ejection fraction we will look for segmental wall motion formality.  I asked her to start taking 1 baby aspirin  every single day.   Atypical chest pain.  We  had a long discussion about what to do with the situation I think the best approach would be to do coronary CT angio to make sure she does not have any obstructive disease. Palpitations, Zio patch will be placed on. Dyslipidemia I did review her K PN she is taking pravastatin 20 her LDL 115 HDL 79.  This is from 12/31/2023 we will wait results of her coronary CT angio to make a decision about how aggressive we need to be with management of this problem   Medication Adjustments/Labs and Tests Ordered: Current medicines are reviewed at length with the patient today.  Concerns regarding medicines are outlined above.  Orders Placed This Encounter  Procedures   CT CORONARY MORPH W/CTA COR W/SCORE W/CA W/CM &/OR WO/CM   LONG TERM MONITOR (3-14 DAYS)   EKG 12-Lead   ECHOCARDIOGRAM COMPLETE   Meds ordered this encounter  Medications   metoprolol  tartrate (LOPRESSOR ) 100 MG tablet    Sig: Take one tablet 2 hours before cardiac CT for heart greater than 55    Dispense:  1 tablet    Refill:  0   aspirin  EC 81 MG tablet    Sig: Take 1 tablet (81 mg total) by mouth daily. Swallow whole.    Signed, Lamar DOROTHA Fitch, MD, [facility]. 03/17/2024 12:04 PM    [facility]

## 2024-03-20 DIAGNOSIS — E119 Type 2 diabetes mellitus without complications: Secondary | ICD-10-CM | POA: Diagnosis not present

## 2024-03-30 ENCOUNTER — Encounter (HOSPITAL_COMMUNITY): Payer: Self-pay

## 2024-03-31 ENCOUNTER — Telehealth (HOSPITAL_COMMUNITY): Payer: Self-pay | Admitting: *Deleted

## 2024-03-31 NOTE — Telephone Encounter (Signed)

## 2024-04-01 ENCOUNTER — Ambulatory Visit (HOSPITAL_COMMUNITY)
Admission: RE | Admit: 2024-04-01 | Discharge: 2024-04-01 | Disposition: A | Discharge status: Home / Self Care | Source: Ambulatory Visit | Attending: Cardiology | Admitting: Cardiology

## 2024-04-01 DIAGNOSIS — R072 Precordial pain: Secondary | ICD-10-CM | POA: Diagnosis not present

## 2024-04-01 LAB — POCT I-STAT CREATININE: Creatinine, Ser: 1 mg/dL (ref 0.44–1.00)

## 2024-04-01 MED ORDER — NITROGLYCERIN 0.4 MG SL SUBL
0.8000 mg | SUBLINGUAL_TABLET | Freq: Once | SUBLINGUAL | Status: AC
  Administered 2024-04-01: 0.8 mg via SUBLINGUAL

## 2024-04-01 MED ORDER — IOHEXOL 350 MG/ML SOLN
100.0000 mL | Freq: Once | INTRAVENOUS | Status: AC | PRN
  Administered 2024-04-01: 100 mL via INTRAVENOUS

## 2024-04-07 DIAGNOSIS — R0789 Other chest pain: Secondary | ICD-10-CM | POA: Diagnosis not present

## 2024-04-08 ENCOUNTER — Ambulatory Visit: Payer: Self-pay | Admitting: Cardiology

## 2024-04-11 DIAGNOSIS — R002 Palpitations: Secondary | ICD-10-CM

## 2024-04-12 ENCOUNTER — Ambulatory Visit (HOSPITAL_BASED_OUTPATIENT_CLINIC_OR_DEPARTMENT_OTHER)
Admission: RE | Admit: 2024-04-12 | Discharge: 2024-04-12 | Disposition: A | Discharge status: Home / Self Care | Source: Ambulatory Visit | Attending: Cardiology | Admitting: Cardiology

## 2024-04-12 DIAGNOSIS — R0609 Other forms of dyspnea: Secondary | ICD-10-CM | POA: Diagnosis not present

## 2024-04-12 LAB — ECHOCARDIOGRAM COMPLETE
AR max vel: 2.73 cm2
AV Area VTI: 2.77 cm2
AV Area mean vel: 2.56 cm2
AV Mean grad: 6 mmHg
AV Peak grad: 10.9 mmHg
Ao pk vel: 1.65 m/s
Area-P 1/2: 2.74 cm2
Calc EF: 59 %
S' Lateral: 1.5 cm
Single Plane A2C EF: 58.3 %
Single Plane A4C EF: 63.4 %

## 2024-04-15 ENCOUNTER — Telehealth: Payer: Self-pay

## 2024-04-15 NOTE — Telephone Encounter (Signed)
   Pre-operative Risk Assessment    Patient Name: Amber Newman  DOB: [birth year] MRN: [id number]   Date of last office visit: 03/17/24 Date of next office visit: 06/01/24   Request for Surgical Clearance    Procedure:  colonoscopy  Date of Surgery:  Clearance TBD                                Surgeon:  Dr. Kristie Socks Group or Practice [facility] Phone number:  [phone] Fax number:  [phone]   Type of Clearance Requested:   - Medical    Type of Anesthesia:  propofol   Additional requests/questions:    SignedAnnabella LITTIE Sayres   04/15/2024, 4:05 PM

## 2024-04-16 NOTE — Telephone Encounter (Signed)
   Patient Name: [name]  DOB: April 17, 1956 MRN: [id number]  Primary Cardiologist: None  Chart reviewed as part of pre-operative protocol coverage. Given past medical history and time since last visit, based on ACC/AHA guidelines, [name] is at acceptable risk for the planned procedure without further cardiovascular testing.   The patient was advised that if she develops new symptoms prior to surgery to contact our office to arrange for a follow-up visit, and she verbalized understanding.  I will route this recommendation to the requesting party via Epic fax function and remove from pre-op pool.  Please call with questions.  Lamarr Satterfield, NP 04/16/2024, 1:24 PM

## 2024-05-14 DIAGNOSIS — Z1211 Encounter for screening for malignant neoplasm of colon: Secondary | ICD-10-CM | POA: Diagnosis not present

## 2024-05-14 DIAGNOSIS — Z800 Family history of malignant neoplasm of digestive organs: Secondary | ICD-10-CM | POA: Diagnosis not present

## 2024-05-15 ENCOUNTER — Emergency Department (HOSPITAL_COMMUNITY)

## 2024-05-15 ENCOUNTER — Other Ambulatory Visit: Payer: Self-pay

## 2024-05-15 ENCOUNTER — Encounter (HOSPITAL_COMMUNITY): Payer: Self-pay | Admitting: Pharmacy Technician

## 2024-05-15 ENCOUNTER — Emergency Department (HOSPITAL_COMMUNITY)
Admission: EM | Admit: 2024-05-15 | Discharge: 2024-05-15 | Disposition: A | Discharge status: Home / Self Care | Attending: Emergency Medicine | Admitting: Emergency Medicine

## 2024-05-15 DIAGNOSIS — Z043 Encounter for examination and observation following other accident: Secondary | ICD-10-CM | POA: Diagnosis not present

## 2024-05-15 DIAGNOSIS — Z79899 Other long term (current) drug therapy: Secondary | ICD-10-CM | POA: Diagnosis not present

## 2024-05-15 DIAGNOSIS — W0110XA Fall on same level from slipping, tripping and stumbling with subsequent striking against unspecified object, initial encounter: Secondary | ICD-10-CM | POA: Insufficient documentation

## 2024-05-15 DIAGNOSIS — R519 Headache, unspecified: Secondary | ICD-10-CM | POA: Diagnosis present

## 2024-05-15 DIAGNOSIS — S0083XA Contusion of other part of head, initial encounter: Secondary | ICD-10-CM | POA: Insufficient documentation

## 2024-05-15 DIAGNOSIS — S199XXA Unspecified injury of neck, initial encounter: Secondary | ICD-10-CM | POA: Diagnosis not present

## 2024-05-15 DIAGNOSIS — Y92002 Bathroom of unspecified non-institutional (private) residence single-family (private) house as the place of occurrence of the external cause: Secondary | ICD-10-CM | POA: Insufficient documentation

## 2024-05-15 DIAGNOSIS — R220 Localized swelling, mass and lump, head: Secondary | ICD-10-CM | POA: Diagnosis not present

## 2024-05-15 DIAGNOSIS — S0993XA Unspecified injury of face, initial encounter: Secondary | ICD-10-CM | POA: Diagnosis not present

## 2024-05-15 DIAGNOSIS — E119 Type 2 diabetes mellitus without complications: Secondary | ICD-10-CM | POA: Insufficient documentation

## 2024-05-15 DIAGNOSIS — W19XXXA Unspecified fall, initial encounter: Secondary | ICD-10-CM

## 2024-05-15 DIAGNOSIS — Z91040 Latex allergy status: Secondary | ICD-10-CM | POA: Diagnosis not present

## 2024-05-15 DIAGNOSIS — I10 Essential (primary) hypertension: Secondary | ICD-10-CM | POA: Insufficient documentation

## 2024-05-15 DIAGNOSIS — H05221 Edema of right orbit: Secondary | ICD-10-CM | POA: Diagnosis not present

## 2024-05-15 DIAGNOSIS — S0990XA Unspecified injury of head, initial encounter: Secondary | ICD-10-CM

## 2024-05-15 DIAGNOSIS — Z7982 Long term (current) use of aspirin: Secondary | ICD-10-CM | POA: Diagnosis not present

## 2024-05-15 NOTE — Discharge Instructions (Signed)
 Your history, exam, and evaluation today are consistent with soft tissue injuries to the right head and face but the CT scan showed no evidence of acute fracture dislocation or intracranial bleeding.  We have a very low suspicion for infection given the imaging results and exam and suspect this is all traumatic swelling and changes and not infection that would need antibiotics at this time.  Please follow-up with your dentist and your primary doctor and if any symptoms change or worsen acutely, please return to the nearest emergency department.  Your x-ray showed the arthritis as we discussed so please follow-up with your hand doctor for further management.

## 2024-05-15 NOTE — ED Provider Notes (Signed)
 [place] EMERGENCY DEPARTMENT AT [facility] Provider Note   CSN: [id number] Arrival date & time: 05/15/24  1054     Patient presents with: Abscess and Fall   Amber Newman is a 68 y.o. female.   The history is provided by the patient and medical records. No language interpreter was used.  Fall This is a new problem. The current episode started 2 days ago. The problem has not changed since onset.Associated symptoms include headaches. Pertinent negatives include no chest pain, no abdominal pain and no shortness of breath. Nothing aggravates the symptoms. Nothing relieves the symptoms. She has tried nothing for the symptoms. The treatment provided no relief.       Prior to Admission medications   Medication Sig Start Date End Date Taking? Authorizing Provider  amLODipine  (NORVASC ) 5 MG tablet Take 5 mg by mouth daily.    [provider]  Ascorbic Acid 500 MG CAPS 2 chewables daily    [provider]  aspirin  EC 81 MG tablet Take 1 tablet (81 mg total) by mouth daily. Swallow whole. 03/17/24   Krasowski, Robert J, MD  guaiFENesin (MUCINEX) 600 MG 12 hr tablet Take 600 mg by mouth as needed for cough.    [provider]  losartan-hydrochlorothiazide (HYZAAR) 100-12.5 MG tablet Take 1 tablet by mouth daily.    [provider]  metoprolol  tartrate (LOPRESSOR ) 100 MG tablet Take one tablet 2 hours before cardiac CT for heart greater than 55 03/17/24   Krasowski, Robert J, MD  Multiple Vitamin (MULTIVITAMIN) tablet Take 1 tablet by mouth daily. Alive for Women 50+    [provider]  Multiple Vitamins-Minerals (ALIVE WOMENS 50+ PO) 1 cap daily    [provider]  Omega 3 1000 MG CAPS 2 caps daily    [provider]  pravastatin (PRAVACHOL) 20 MG tablet Take 20 mg by mouth once a week.    [provider]  Probiotic CHEW 1 tab daily    [provider]  vitamin C (ASCORBIC ACID) 500 MG tablet Take 500  mg by mouth daily. Chewable tablets    [provider]  vitamin E 1000 UNIT capsule Take 1,000 Units by mouth daily.    [provider]    Allergies: Shingrix [zoster vac recomb adjuvanted], Codeine, Latex, Penicillins, and Sulfa antibiotics    Review of Systems  Constitutional:  Negative for chills, fatigue and fever.  HENT:  Positive for dental problem (r lower jaw pain). Negative for congestion.   Eyes:  Positive for visual disturbance (resolved now). Negative for pain.  Respiratory:  Negative for cough, chest tightness, shortness of breath and wheezing.   Cardiovascular:  Negative for chest pain, palpitations and leg swelling.  Gastrointestinal:  Negative for abdominal pain, constipation, diarrhea, nausea and vomiting.  Genitourinary:  Negative for dysuria.  Musculoskeletal:  Positive for neck pain. Negative for back pain and neck stiffness.  Skin:  Negative for rash and wound.  Neurological:  Positive for headaches. Negative for weakness, light-headedness and numbness.  Psychiatric/Behavioral:  Negative for agitation and confusion.   All other systems reviewed and are negative.   Updated Vital Signs BP (!) 149/66 (BP Location: Right Arm)   Pulse 99   Temp 98.1 F (36.7 C)   Resp 18   Ht 5' 5 (1.651 m)   Wt 104.3 kg   SpO2 98%   BMI 38.27 kg/m   Physical Exam Vitals and nursing note reviewed.  Constitutional:  General: She is not in acute distress.    Appearance: She is well-developed.  HENT:     Head: Contusion present. No laceration.     Jaw: Tenderness present. No trismus or malocclusion.      Comments: Bruising on right forehead and scalp, tenderness on right lower jaw.  Some tenderness on right cheek.  Pupils symmetric and reactive with normal extraocular movements.  No evidence of entrapment.  No neck tenderness manage had some mild neck discomfort.    Nose: No congestion or rhinorrhea.     Mouth/Throat:     Mouth: Mucous membranes are  moist.     Pharynx: No oropharyngeal exudate or posterior oropharyngeal erythema.  Eyes:     Extraocular Movements: Extraocular movements intact.     Conjunctiva/sclera: Conjunctivae normal.     Pupils: Pupils are equal, round, and reactive to light.  Cardiovascular:     Rate and Rhythm: Normal rate and regular rhythm.     Pulses: Normal pulses.     Heart sounds: No murmur heard. Pulmonary:     Effort: Pulmonary effort is normal. No respiratory distress.     Breath sounds: Normal breath sounds. No wheezing, rhonchi or rales.  Chest:     Chest wall: No tenderness.  Abdominal:     Palpations: Abdomen is soft.     Tenderness: There is no abdominal tenderness. There is no guarding or rebound.  Musculoskeletal:        General: Tenderness present. No swelling.     Cervical back: Neck supple. No tenderness.     Comments: Tenderness on right index finger after fall intact sensation strength and pulse otherwise.  Good grips.  Skin:    General: Skin is warm and dry.     Capillary Refill: Capillary refill takes less than 2 seconds.     Findings: Bruising present. No erythema.  Neurological:     General: No focal deficit present.     Mental Status: She is alert.     Sensory: No sensory deficit.     Motor: No weakness.     Coordination: Coordination normal.  Psychiatric:        Mood and Affect: Mood normal.     (all labs ordered are listed, but only abnormal results are displayed) Labs Reviewed - No data to display  EKG: None  Radiology: CT Maxillofacial Wo Contrast Result Date: 05/15/2024 EXAM: CT OF THE FACE WITHOUT CONTRAST 05/15/2024 01:14:59 PM TECHNIQUE: CT of the face was performed without the administration of intravenous contrast. Multiplanar reformatted images are provided for review. Automated exposure control, iterative reconstruction, and/or weight based adjustment of the mA/kV was utilized to reduce the radiation dose to as low as reasonably achievable. COMPARISON: None  available. CLINICAL HISTORY: Facial trauma, blunt. Chief complaints; Abscess; Fall; CT Head Wo Contrast; Polytrauma, blunt; CT Maxillofacial Wo Contrast; Facial trauma, blunt; CT Cervical Spine Wo Contrast; Polytrauma, blunt FINDINGS: FACIAL BONES: No acute facial fracture. No mandibular dislocation. No suspicious bone lesion. ORBITS: Globes are intact. No acute traumatic injury. No inflammatory change. Atherosclerotic calcifications are present in the cavernous carotid arteries bilaterally. No hyperdense vessel is present. SINUSES AND MASTOIDS: A polypormyx retention cyst is present in the left maxillary sinus. SOFT TISSUES: Right supraorbital scalp soft tissue swelling is present. IMPRESSION: 1. No acute facial fracture. 2. Right supraorbital scalp soft tissue swelling without underlying fracture. Electronically signed by: Lonni Necessary MD 05/15/2024 01:32 PM EDT RP Workstation: [id number]   CT Cervical Spine Wo Contrast Result Date:  05/15/2024 EXAM: CT CERVICAL SPINE WITHOUT CONTRAST 05/15/2024 01:14:59 PM TECHNIQUE: CT of the cervical spine was performed without the administration of intravenous contrast. Multiplanar reformatted images are provided for review. Automated exposure control, iterative reconstruction, and/or weight based adjustment of the mA/kV was utilized to reduce the radiation dose to as low as reasonably achievable. COMPARISON: None available. CLINICAL HISTORY: Polytrauma, blunt. Chief complaints; Abscess; Fall; CT Head Wo Contrast; Polytrauma, blunt; CT Maxillofacial Wo Contrast; Facial trauma, blunt; CT Cervical Spine Wo Contrast; Polytrauma, blunt. FINDINGS: CERVICAL SPINE: BONES AND ALIGNMENT: Straightening of the normal cervical lordosis is present. No acute fracture or traumatic malalignment. DEGENERATIVE CHANGES: No significant degenerative changes. SOFT TISSUES: No prevertebral soft tissue swelling. IMPRESSION: 1. No acute abnormality of the cervical spine. 2. Straightening of  the normal cervical lordosis. Electronically signed by: Lonni Necessary MD 05/15/2024 01:28 PM EDT RP Workstation: [id number]   CT Head Wo Contrast Result Date: 05/15/2024 EXAM: CT HEAD WITHOUT CONTRAST 05/15/2024 01:14:59 PM TECHNIQUE: CT of the head was performed without the administration of intravenous contrast. Automated exposure control, iterative reconstruction, and/or weight based adjustment of the mA/kV was utilized to reduce the radiation dose to as low as reasonably achievable. COMPARISON: None available. CLINICAL HISTORY: Polytrauma, blunt. Chief complaints; Abscess; Fall; CT Head Wo Contrast; Polytrauma, blunt; CT Maxillofacial Wo Contrast; Facial trauma, blunt; CT Cervical Spine Wo Contrast; Polytrauma, blunt FINDINGS: BRAIN AND VENTRICLES: No acute hemorrhage. No evidence of acute infarct. No hydrocephalus. No extra-axial collection. No mass effect or midline shift. ORBITS: No acute abnormality. SINUSES: A polyp or mucous retention cyst is present in the left maxillary sinus. SOFT TISSUES AND SKULL: Right superior parietal scalp soft tissue swelling present without underlying fracture. IMPRESSION: 1. Right superior parietal scalp soft tissue swelling without underlying fracture. 2. No acute intracranial abnormality. Electronically signed by: Lonni Necessary MD 05/15/2024 01:27 PM EDT RP Workstation: [id number]   DG Hand Complete Right Result Date: 05/15/2024 EXAM: 3 OR MORE VIEW(S) XRAY OF THE HAND 05/15/2024 12:47:00 PM COMPARISON: None available. CLINICAL HISTORY: fall. Per ED triage notes: Pt here with reports of Thursday night patient started on colonoscopy prep, fell while going to the bathroom hitting head. Went to UC and sent here for possible CT scan. Denies LOC, not on anticoagulation. Pt also complains of ; dental abscess. Pt states when she fell she hit her hand as well. FINDINGS: BONES AND JOINTS: No acute fracture. 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 Degenerative changes at the base of the thumb and metacarpophalangeal joints. SOFT TISSUES: The soft tissues are unremarkable. IMPRESSION: 1. Scapholunate dissociation with proximal migration of the capitate, consistent with SLAC wrist. 2. No acute findings. Electronically signed by: Waddell Calk MD 05/15/2024 01:24 PM EDT RP Workstation: [id number]     Procedures   Medications Ordered in the ED - No data to display                                  Medical Decision Making Amount and/or Complexity of Data Reviewed Radiology: ordered.    Amber Newman is a 68 y.o. female with a past medical history significant for hypertension, diabetes, previous anemia, and patient report of chronic right sided weakness who presents with a fall.  Patient reports that she was prepping for a colonoscopy yesterday and the night before had to rush to the bathroom and tripped and fell.  She reports  she hit her right hand, right knee, and head on the wall and did not get knocked out.  She reports some headache that she is still having but she was still able to get her colonoscopy yesterday.  She says that she does not any vision changes now but briefly had some spiderweb vision that has resolved.  She says she has had that in the past.  She reports some swelling on her right forehead was present with hematoma and contusion but then she developed some swelling in her right jaw and she was worried she could have a dental abscess.  She does report having a history of tooth fracture and dental problem as well.  She called her dentist and cannot get seen till Monday and they told her to come get evaluated for possible dental abscess or infection.  Patient otherwise denies any chest pain, shortness of breath, cough, nausea, vomiting, constipation, diarrhea, or urinary changes.  She does report some mild neck pain with her headache and face pain.  On exam, patient has a tenderness to  her right forehead right cheek and right jaw although she did not have trismus and could open her mouth normally.  Oropharyngeal exam otherwise unremarkable and I did not see erythema or drainage.  Neck was not focally tender on the midline.  Her right index finger was slightly tender but rest of hand was unremarkable.  No snuffbox tenderness and she had good grip strength bilateral.  Normal sensation and pulses.  Legs nontender nonedematous and she did not have any tenderness on her right knee where she had some bruising.  We had a shared decision-making conversation and patient says she does not want imaging of her knee as it is not bothering her and she walk from the parking lot normally.  She does however agree to get x-ray of the hand, and CT scan of the face, neck and head.  If imaging reassuring, dissipate discharge home for outpatient follow-up.  We discussed that given her dental history and her dentist concern for possible infection, we may discuss giving her prescription for antibiotics to treat early dental infection although clinical seems more likely that the edema from the right forehead contusion may have followed gravity and pulled in the lower part of her face and jaw.  2:26 PM CT scans returned reassuring.  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  Patient reports she knows about this and is seeing her hand doctor in the past and says it is unchanged and she does not want a wrist brace when offered.  We discussed that given the traumatic injury and the lack of evidence of dental infection or dental abscess on imaging we will hold on antibiotics and she can see her dentist in the morning on Monday.  Patient agrees to this plan.  Patient we discharged for outpatient follow-up and she understood return precautions.  She had no other questions or concerns and patient was discharged in good condition.      Final diagnoses:  Fall, initial  encounter  Injury of head, initial encounter  Contusion of face, initial encounter    ED Discharge Orders     None       Clinical Impression: 1. Fall, initial encounter   2. Injury of head, initial encounter   3. Contusion of face, initial encounter     Disposition: Discharge  Condition: Good  I have discussed the results, Dx and Tx plan  with the pt(& family if present). He/she/they expressed understanding and agree(s) with the plan. Discharge instructions discussed at great length. Strict return precautions discussed and pt &/or family have verbalized understanding of the instructions. No further questions at time of discharge.    New Prescriptions   No medications on file    Follow Up: Okey Carlin Redbird, MD [address] [place] [address] [phone]     your dentist         Deontrey Massi, Lonni PARAS, MD 05/15/24 [phone]

## 2024-05-15 NOTE — ED Triage Notes (Signed)
 Pt here with reports of Thursday night patient started on colonoscopy prep, fell while going to the bathroom hitting head. Went to UC and sent here for possible CT scan. Denies  LOC, not on anticoagulation. Pt also complains of dental abscess.

## 2024-06-01 ENCOUNTER — Ambulatory Visit: Attending: Cardiology | Admitting: Cardiology

## 2024-06-01 ENCOUNTER — Encounter: Payer: Self-pay | Admitting: *Deleted

## 2024-06-01 ENCOUNTER — Encounter: Payer: Self-pay | Admitting: Cardiology

## 2024-06-01 VITALS — BP 130/88 | HR 105 | Ht 65.0 in | Wt 233.0 lb

## 2024-06-01 DIAGNOSIS — I10 Essential (primary) hypertension: Secondary | ICD-10-CM

## 2024-06-01 DIAGNOSIS — R002 Palpitations: Secondary | ICD-10-CM

## 2024-06-01 DIAGNOSIS — R0789 Other chest pain: Secondary | ICD-10-CM

## 2024-06-01 DIAGNOSIS — R0609 Other forms of dyspnea: Secondary | ICD-10-CM | POA: Diagnosis not present

## 2024-06-01 NOTE — Progress Notes (Signed)
 Cardiology Office Note:    Date:  06/01/2024   ID:  Amber Newman, DOB 01-30-1956, MRN [id number]  PCP:  Okey Carlin Redbird, MD  Cardiologist:  Lamar Fitch, MD    Referring MD: Okey Carlin Redbird, MD   Chief Complaint  Patient presents with   Follow-up  Doing fine  History of Present Illness:    Amber Newman is a 68 y.o. female past medical history significant for palpitation, dyspnea on exertion, diabetes, essential hypertension, dyslipidemia.  She was referred to us  because of constellation of atypical symptoms quite extensive evaluation has been performed, her coronary CT angio showed calcium score 0 and no evidence of coronary artery disease, echocardiogram revealed structurally normal heart without significant valve pathology, her monitor showed only very short run of supraventricular tachycardia asymptomatic all test came back good.  Since I have seen her last time she ended up having colonoscopy which was good.  Otherwise doing well  Past Medical History:  Diagnosis Date   Diabetes mellitus type 2, diet-controlled (HCC) 10/28/2013   Diabetes mellitus without complication (HCC)    HSV infection    HTN (hypertension) 10/28/2013   Hypertension     Past Surgical History:  Procedure Laterality Date   BREAST BIOPSY Right 02/23/2024   MM RT BREAST BX W LOC DEV 1ST LESION IMAGE BX SPEC STEREO GUIDE 02/23/2024 [facility]   EYE SURGERY     TONSILLECTOMY      Current Medications: Current Meds  Medication Sig   amLODipine  (NORVASC ) 5 MG tablet Take 5 mg by mouth daily.   Ascorbic Acid 500 MG CAPS 2 chewables daily   Ashwagandha 125 MG CAPS Take 1 capsule by mouth daily.   aspirin  EC 81 MG tablet Take 1 tablet (81 mg total) by mouth daily. Swallow whole.   B Complex Vitamins (VITAMIN B COMPLEX) CAPS Take 1 capsule by mouth daily.   Cholecalciferol 50 MCG (2000 UT) CAPS Take 1 capsule by mouth daily.   Cranberry-Vitamin C (CRANBERRY CONCENTRATE/VITAMINC) 140-100 MG  CAPS Take 2 capsules by mouth daily.   Garlic 200 MG TABS Take 200 mg by mouth daily.   losartan-hydrochlorothiazide (HYZAAR) 100-12.5 MG tablet Take 1 tablet by mouth daily.   Multiple Vitamins-Minerals (ALIVE WOMENS 50+ PO) 1 cap daily   Omega 3 1000 MG CAPS 2 caps daily   pravastatin (PRAVACHOL) 20 MG tablet Take 20 mg by mouth once a week.   Probiotic CHEW 1 tab daily   vitamin C (ASCORBIC ACID) 500 MG tablet Take 500 mg by mouth daily. Chewable tablets   vitamin E 1000 UNIT capsule Take 1,000 Units by mouth daily.     Allergies:   Ace inhibitors, Echinacea, Shingrix [zoster vac recomb adjuvanted], Zinc, Codeine, Latex, Penicillins, and Sulfa antibiotics   Social History   Socioeconomic History   Marital status: Single    Spouse name: Not on file   Number of children: Not on file   Years of education: Not on file   Highest education level: Not on file  Occupational History   Not on file  Tobacco Use   Smoking status: Never   Smokeless tobacco: Never  Substance and Sexual Activity   Alcohol use: No   Drug use: Not on file   Sexual activity: Not on file  Other Topics Concern   Not on file  Social History Narrative   Not on file   Social Drivers of Health   Financial Resource Strain: Not on file  Food Insecurity:  Not on file  Transportation Needs: Not on file  Physical Activity: Not on file  Stress: Not on file  Social Connections: Not on file     Family History: The patient's family history includes Breast cancer in her maternal grandmother; Breast cancer (age of onset: 48) in her cousin; Breast cancer (age of onset: 103) in her paternal aunt; Breast cancer (age of onset: 68) in her niece. ROS:   Please see the history of present illness.    All 14 point review of systems negative except as described per history of present illness  EKGs/Labs/Other Studies Reviewed:         Recent Labs: 04/01/2024: Creatinine, Ser 1.00  Recent Lipid Panel No results found  for: CHOL, TRIG, HDL, CHOLHDL, VLDL, LDLCALC, LDLDIRECT  Physical Exam:    VS:  BP (!) 144/90   Pulse (!) 105   Ht 5' 5 (1.651 m)   Wt 233 lb (105.7 kg)   SpO2 99%   BMI 38.77 kg/m     Wt Readings from Last 3 Encounters:  06/01/24 233 lb (105.7 kg)  05/15/24 230 lb (104.3 kg)  03/17/24 230 lb 12.8 oz (104.7 kg)     GEN:  Well nourished, well developed in no acute distress HEENT: Normal NECK: No JVD; No carotid bruits LYMPHATICS: No lymphadenopathy CARDIAC: RRR, no murmurs, no rubs, no gallops RESPIRATORY:  Clear to auscultation without rales, wheezing or rhonchi  ABDOMEN: Soft, non-tender, non-distended MUSCULOSKELETAL:  No edema; No deformity  SKIN: Warm and dry LOWER EXTREMITIES: no swelling NEUROLOGIC:  Alert and oriented x 3 PSYCHIATRIC:  Normal affect   ASSESSMENT:    1. Primary hypertension   2. Atypical chest pain   3. Palpitations   4. Dyspnea on exertion    PLAN:    In order of problems listed above:  Essential hypertension, blood pressure elevated but she is excited today she states she was late because of rainy day, we will recheck her, blood pressure before she get out of the room. Atypical chest pain coronary CT angio normal calcium score 0 noncardiac pain continue follow-up with primary care physician. Palpitations nothing exciting identified on the monitor.  No need to treat in the future she may benefit from small dose of beta-blockers. Dyspnea on exertion multifactorial recommended to start exercising on the regular basis she does have Silver sneakers and she is planning to go to [facility] which I think is an excellent idea   Medication Adjustments/Labs and Tests Ordered: Current medicines are reviewed at length with the patient today.  Concerns regarding medicines are outlined above.  No orders of the defined types were placed in this encounter.  Medication changes: No orders of the defined types were placed in this  encounter.   Signed, Lamar DOROTHA Fitch, MD, [facility] 06/01/2024 10:51 AM    [facility]

## 2024-06-01 NOTE — Patient Instructions (Signed)
Medication Instructions:  Your physician recommends that you continue on your current medications as directed. Please refer to the Current Medication list given to you today.  *If you need a refill on your cardiac medications before your next appointment, please call your pharmacy*   Lab Work: None Ordered If you have labs (blood work) drawn today and your tests are completely normal, you will receive your results only by: MyChart Message (if you have MyChart) OR A paper copy in the mail If you have any lab test that is abnormal or we need to change your treatment, we will call you to review the results.   Testing/Procedures: None Ordered   Follow-Up: At [facility], you and your health needs are our priority.  As part of our continuing mission to provide you with exceptional heart care, we have created designated Provider Care Teams.  These Care Teams include your primary Cardiologist (physician) and Advanced Practice Providers (APPs -  Physician Assistants and Nurse Practitioners) who all work together to provide you with the care you need, when you need it.  We recommend signing up for the patient portal called "MyChart".  Sign up information is provided on this After Visit Summary.  MyChart is used to connect with patients for Virtual Visits (Telemedicine).  Patients are able to view lab/test results, encounter notes, upcoming appointments, etc.  Non-urgent messages can be sent to your provider as well.   To learn more about what you can do with MyChart, go to [email or web address].    Your next appointment:   As needed  The format for your next appointment:   In Person  Provider:   Robert Krasowski, MD    Other Instructions NA  

## 2024-06-22 DIAGNOSIS — I10 Essential (primary) hypertension: Secondary | ICD-10-CM | POA: Diagnosis not present

## 2024-06-22 DIAGNOSIS — Z6841 Body Mass Index (BMI) 40.0 and over, adult: Secondary | ICD-10-CM | POA: Diagnosis not present

## 2024-06-22 DIAGNOSIS — E1169 Type 2 diabetes mellitus with other specified complication: Secondary | ICD-10-CM | POA: Diagnosis not present
